# Patient Record
Sex: Male | Born: 1935 | Race: White | Hispanic: No | Marital: Married
Health system: Midwestern US, Academic
[De-identification: ages and names within clinical notes are randomized; demographics above are authoritative.]

---

## 2017-03-24 ENCOUNTER — Encounter: Admit: 2017-03-24 | Discharge: 2017-03-24 | Payer: MEDICARE

## 2017-03-24 NOTE — Telephone Encounter
INR Overdue.  Called and left message requesting pt to have INR drawn at earliest convenience.  Left callback number for any questions or concerns.

## 2017-03-29 ENCOUNTER — Encounter: Admit: 2017-03-29 | Discharge: 2017-03-29 | Payer: MEDICARE

## 2017-04-03 ENCOUNTER — Encounter: Admit: 2017-04-03 | Discharge: 2017-04-03 | Payer: MEDICARE

## 2017-04-05 MED ORDER — WARFARIN 5 MG PO TAB
ORAL_TABLET | ORAL | 4 refills | 90.00000 days | Status: AC
Start: 2017-04-05 — End: 2018-02-09

## 2017-04-13 ENCOUNTER — Encounter: Admit: 2017-04-13 | Discharge: 2017-04-13 | Payer: MEDICARE

## 2017-04-13 DIAGNOSIS — Z7901 Long term (current) use of anticoagulants: Principal | ICD-10-CM

## 2017-04-13 MED ORDER — WARFARIN 1 MG PO TAB
ORAL_TABLET | Freq: Every day | ORAL | 0 refills | 90.00000 days | Status: AC
Start: 2017-04-13 — End: 2017-12-13

## 2017-05-04 ENCOUNTER — Encounter: Admit: 2017-05-04 | Discharge: 2017-05-04 | Payer: MEDICARE

## 2017-05-04 NOTE — Telephone Encounter
Spoke to pt re: overdue INR. He states he is going to cardiac rehab on Thursday and plans to have INR at that time. No further needs identified at this time.    ----- Message -----  From: Baldwin Crown, RN  Sent: 04/26/2017  To: Suzanne Boron Nurse [facility]  Subject: INR Due 04/26/17                                   Kristeen Miss is due for an INR test on 04/26/17.

## 2017-05-06 ENCOUNTER — Encounter: Admit: 2017-05-06 | Discharge: 2017-05-06 | Payer: MEDICARE

## 2017-05-06 DIAGNOSIS — Z7901 Long term (current) use of anticoagulants: Principal | ICD-10-CM

## 2017-05-06 DIAGNOSIS — Z86711 Personal history of pulmonary embolism: ICD-10-CM

## 2017-06-11 ENCOUNTER — Encounter: Admit: 2017-06-11 | Discharge: 2017-06-11 | Payer: MEDICARE

## 2017-06-16 ENCOUNTER — Encounter: Admit: 2017-06-16 | Discharge: 2017-06-16 | Payer: MEDICARE

## 2017-06-16 DIAGNOSIS — Z86711 Personal history of pulmonary embolism: ICD-10-CM

## 2017-06-16 DIAGNOSIS — Z7901 Long term (current) use of anticoagulants: Principal | ICD-10-CM

## 2017-07-30 ENCOUNTER — Encounter: Admit: 2017-07-30 | Discharge: 2017-07-30 | Payer: MEDICARE

## 2017-07-30 NOTE — Telephone Encounter
INR Overdue.  Called and discussed with patient who states he will have INR drawn next week.

## 2017-08-06 ENCOUNTER — Encounter: Admit: 2017-08-06 | Discharge: 2017-08-06 | Payer: MEDICARE

## 2017-08-10 ENCOUNTER — Ambulatory Visit: Admit: 2017-08-10 | Discharge: 2017-08-11 | Payer: BC Managed Care – PPO

## 2017-08-10 ENCOUNTER — Encounter: Admit: 2017-08-10 | Discharge: 2017-08-10 | Payer: MEDICARE

## 2017-08-10 DIAGNOSIS — I351 Nonrheumatic aortic (valve) insufficiency: ICD-10-CM

## 2017-08-10 DIAGNOSIS — Z7901 Long term (current) use of anticoagulants: ICD-10-CM

## 2017-08-10 DIAGNOSIS — E785 Hyperlipidemia, unspecified: Secondary | ICD-10-CM

## 2017-08-10 DIAGNOSIS — I2510 Atherosclerotic heart disease of native coronary artery without angina pectoris: Secondary | ICD-10-CM

## 2017-08-10 DIAGNOSIS — I739 Peripheral vascular disease, unspecified: ICD-10-CM

## 2017-08-10 DIAGNOSIS — I10 Essential (primary) hypertension: ICD-10-CM

## 2017-08-10 DIAGNOSIS — N189 Chronic kidney disease, unspecified: ICD-10-CM

## 2017-08-10 DIAGNOSIS — I219 Acute myocardial infarction, unspecified: ICD-10-CM

## 2017-08-10 DIAGNOSIS — Z86711 Personal history of pulmonary embolism: Principal | ICD-10-CM

## 2017-08-10 DIAGNOSIS — I714 Abdominal aortic aneurysm, without rupture: ICD-10-CM

## 2017-08-10 DIAGNOSIS — Z87891 Personal history of nicotine dependence: ICD-10-CM

## 2017-08-10 DIAGNOSIS — I252 Old myocardial infarction: ICD-10-CM

## 2017-08-10 DIAGNOSIS — I749 Embolism and thrombosis of unspecified artery: ICD-10-CM

## 2017-08-10 DIAGNOSIS — Z9861 Coronary angioplasty status: ICD-10-CM

## 2017-08-10 DIAGNOSIS — E7800 Pure hypercholesterolemia, unspecified: ICD-10-CM

## 2017-08-10 DIAGNOSIS — K219 Gastro-esophageal reflux disease without esophagitis: ICD-10-CM

## 2017-08-10 DIAGNOSIS — Z951 Presence of aortocoronary bypass graft: ICD-10-CM

## 2017-08-10 NOTE — Progress Notes
Date of Service: 08/10/2017    Scott Watkins is a 83 y.o. male.       HPI     Scott Watkins returns for follow-up about 10 months after I last saw him.  At that time I was focused on asymptomatic coronary artery disease post Bypass, .hyperlipidemia, hypertension and a stable nonthreatening abdominal aortic dilation.  I had concerns about labile hypertension and whether he needed more aggressive treatment.  Since I seen him he is had no major cardiovascular problems arise.  He continues to do cardiac rehab in [place] [facility] 2 days a week.  He does about 40 minutes of exercise with elliptical exercise bike and some resistance work.  He brought in several blood pressure recordings from the rehab outpatient flowsheet.  His blood pressures are consistently in the 130/80 range at rest with readings commonly in the 110-120 range after exercise and no readings with pressures above 150 during exercise.  These are all fully satisfactory.  These compare favorably to the readings of 160 that he had when I saw him in the office last December.    His echo showed mild LV systolic dysfunction with EF 45% he had a wall thickness consistent with LV hypertrophy at about 1.2 cm.  He had no significant valvular disease.  He had mild LV dilation at 5.1 cm LVEDD with moderately severe aortic regurgitation.  I note from my record last year that he did not have a murmur of aortic regurgitation.    Scott Watkins does not have a lot of vigorous physical activity other than during cardiac rehab.  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  His associate who was carrying some of the workload left about a year and a half ago and has not been replaced.    Scott Watkins notes the difficulty in trying to lose weight with calorie restriction when he is engaged in a lot of social activities at church that revolve around food.  He notes difficulty in losing weight by exercise having been told that after his 20 minutes on the exercise bike the calories she burned to been the equivalent of about 2 cookies.    He inquired about the necessity for continuing the medications that he is on.           Vitals:    08/10/17 1448 08/10/17 1504   BP: 122/78 124/74   Pulse: 61    Weight: 88.9 kg (196 lb)    Height: 1.676 m (5' 6)      Body mass index is 31.64 kg/m???.     Past Medical History  Patient Active Problem List    Diagnosis Date Noted   ??? Neuropathy 12/14/2012   ??? PVD (peripheral vascular disease) (HCC) 12/08/2012     07/06/11 ABI during w/u leading to Dx peripheral neuropathy: Normal resting ABIs,  ???Noncompressible immediate post-exercise L ABLI, , with normal values 2 minutes post exercise., Normal post-exercise right ankle brachial indices.     ??? Chronic anticoagulation on warfarin 07/19/2012   ??? Nasal polyp 09/22/2011   ??? AAA (abdominal aortic aneurysm) (HCC) 07/08/2011   ??? 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 Dysphonia 06/24/2011   ??? Laryngopharyngeal reflux 06/24/2011   ??? Chronic rhinitis 06/24/2011   ??? History of pulmonary embolism 11/14/2010     11/13/2010 SOA during a church service associate with aphonia. Cath neg, CT PAgram: Lot in PA to RLL   Rx warfarin  11/14/2010 Venous  US: No DVT.      ??? CKD (chronic kidney disease) 11/13/2010        06/20/2014  09/25/2015  01/04/2016  01/05/2016  01/06/2016  01/07/2016    BUN 22 (H) 10 21 24 30  (H) 27 (H)   Creatinine 1.5 (H) 1.44 (H) 1.32 (H) 1.57 (H) 1.58 (H) 1.38 (H)        ??? CAD (coronary artery disease) 02/07/2009     1988 IMI, Rx lytics and RCA angioplasty 9 years after stopping smoking.   1991,Angina, Balloon angioplasty 95% LAD  Saw Dr. Alvester Morin  Sept 2004: CABx4: LIMA to LAD. Radial artery T-graft from the LIMA to OMB and the distal RCA, and SVG to DIAG by Dr. Marylouise Stacks.  2007: Stent PCI to RCA  11/13/10 cath by Dr. Paris Lore: 90% m LAD,  80-90%dLAD; 50-60% pOM1, 50-60% mPLV,Patent SVG-Dx,  patent LIMA but atretic radial T-graft , faint visualization of antegrade flow OM1 and PL branch.   07/19/12:Cath Patent LIMA-mLAD w/ Radial T to OM-rPL pbranch. patent SVG-Dx, 90% mLAD, 60% OM1, 90% Cx, 60% pR PL Branch      ??? History of acute lateral wall MI 02/07/2009   ??? 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 Hyperlipidemia 02/07/2009     01/05/16 Total 99 Trig 145 HDL 32 LDL 50 on Crestor 10.         ??? Essential hypertension 02/07/2009         Review of Systems   Constitution: Negative.   HENT: Negative.    Eyes: Negative.    Cardiovascular: Negative.    Respiratory: Negative.    Endocrine: Negative.    Hematologic/Lymphatic: Negative.    Skin: Negative.    Musculoskeletal: Negative.    Gastrointestinal: Negative.    Genitourinary: Negative.    Neurological: Negative.    Psychiatric/Behavioral: Negative.    Allergic/Immunologic: Negative.    14 organ system review noted. It is negative except as reported in current narrative or above in the ROS section. This is a patient centered review of systems that was stated by the patient in his terms prior to my personal problem oriented interview with the patient      Physical Exam  Watkins: Patient in no distress, looks generally healthy. Skin warm and dry, non icteric,    Mucous membranes moist.  Pupils equal and round  Carotids: no bruits    Thyroid not enlarged.  Neck veins: CVP <6 normal, no V wave, no HJR     Respiratory: Breathing comfortably. Lungs clear to percussion & auscultation. No rales, rhonchi or wheezing   Cardiac: Regular rhythm. LV impulse not palpable. Normal S1 & S2, Fourth heart sound, no rub or S3. No murmur  Abdomen: soft, non-tender, no masses,bruits,hepatic or aortic enlargement. + bowel sounds.   Femoral arteries: Good pulses, no bruits.  Legs/feet: Normal PT pulses, trace bilat low leg edema.   Motor: Normal muscle strength. Cognitive: Pleasant demeanor. Good insight. No depression    Cardiovascular Studies  Today's 12 lead EKG: sinus rhythm, rate 61. EKG is unchanged from prior tracings.  QRS configuration is identical to prior tracing with QRS duration 122 on both tracings left bundle branch block was called on this EKG by computer but not on the prior    His lipid profile from  March 2017  looks good with LDL 50 HDL suboptimal at 32 but not manageable with any specific med.   01/05/2016 04:17   Cholesterol 99   Triglycerides 145  HDL 32 (L)   LDL 50     Problems Addressed Today  No diagnosis found.    Assessment and Plan     Scott Watkins is doing well.  He has no symptoms of heart failure angina or arrhythmias.  His blood pressure control as evidenced by his flowsheet from cardiac rehab looks good as do the pressures today.  I did do not think that is reasonable to try to increase any of his medications even though his LV was slightly dilated with EF 45% when checked last December.  He has no findings of volume overload on exam today.  I cannot even hear a murmur of aortic insufficiency in spite of the fact that moderate AI was reported on the Doppler.    Interval surveillance of his LV function and valve disease are warranted.  I think it would be reasonable to extend the follow-up about 18 months from the prior echo.  Basically I like to see him again in the spring or summer which would make about 6-18 months since his last echo.  Like him to have another echo done shortly before he sees me in [place] so I can review the results.  The meantime I like him to continue his physical activity and try to reduce calories and lose a little weight.    As mentioned his blood pressure control looks good.    His lipid control when last checked 18 months ago looked good.  I have asked him to come in for a lipid profile one morning when he is not eaten and is here for the exercise class.  He can have a snack of some sort after the blood is taken but before he exercises if he needs to.    I reviewed his indications for oral anticoagulation with him.  In 2012 he had an unprovoked pulmonary embolism that was not associated with any type of leg compression injury or other procedure.  He had a major embolism that sounds as if it were like a saddle embolism.  In this case with no identifiable cause of the clot and severe well-documented pulmonary embolism he needs to be managed as an unprovoked PE which calls for and permanent/indefinite oral anticoagulation.  He is doing well with good pro time readings over 6 years on warfarin and can continue it.  He is comfortable doing so as long as he has clear understanding that it is necessary.    He has minimal edema which may be related to his Norvasc 5.  The edema does not bother him and his blood pressure control is satisfactory so I am leaving him on the amlodipine.  Her graph I like to see him again in 6 months with an echo Doppler done in [facility] shortly before he comes in.  In the meantime he continues his current meds.  I want him to continue his exercise program and monitoring his blood pressures.  I am sure the cardiac rehab team will advise him if his pressures are creeping upward and get him in to see his primary care physician Dr. Erskine Emery.             Current Medications (including today's revisions)  ??? amLODIPine (NORVASC) 5 mg tablet Take 1 tablet by mouth daily.   ??? aspirin EC 81 mg tablet Take 1 Tab by mouth daily.   ??? COQ10 (UBIQUINOL) PO Take 1 Tab by mouth daily.   ??? ezetimibe (ZETIA) 10 mg tablet Take 10  mg by mouth Daily.   ??? guaiFENesin LA (MUCINEX) 600 mg tablet Take 600 mg by mouth twice daily as needed.   ??? ketorolac (ACULAR) 0.5 % ophthalmic solution Apply 1 drop to left eye as directed three times daily.   ??? latanoprost (XALATAN) 0.005 % ophthalmic solution Apply 1 drop to left eye as directed three times daily.   ??? loratadine (CLARITIN) 10 mg tablet Take 10 mg by mouth Daily.   ??? losartan (COZAAR) 50 mg tablet TAKE 1 TABLET DAILY ??? metoprolol XL (TOPROL XL) 100 mg tablet Take 100 mg by mouth at bedtime daily.   ??? nitroglycerin (NITROSTAT) 0.4 mg tablet Place 1 Tab under tongue every 5 minutes as needed for Chest Pain. As directed   ??? Omega-3 Fatty Acids 1,000 mg capsule Take 1,000 mg by mouth Daily.   ??? omeprazole DR(+) (PRILOSEC) 20 mg capsule Take 1 Cap by mouth daily.   ??? prednisolone acetate (PRED FORTE) 1 % ophthalmic suspension Apply 1 drop to left eye as directed three times daily.   ??? rosuvastatin (CRESTOR) 10 mg tablet Take 10 mg by mouth Daily.   ??? vitamins, multiple Cap Take 1 Cap by mouth Daily.   ??? warfarin (COUMADIN) 1 mg tablet TAKE ONE TABLET BY MOUTH ONCE DAILY   ??? warfarin (COUMADIN) 5 mg tablet TAKE ONE-HALF TABLET BY MOUTH ON MONDAY AND FRIDAY. TAKE ONE TABLET BY MOUTH ON ALL OTHER DAYS.

## 2017-08-25 ENCOUNTER — Encounter: Admit: 2017-08-25 | Discharge: 2017-08-25 | Payer: MEDICARE

## 2017-08-25 NOTE — Telephone Encounter
Spoke to pt re: overdue INR. Pt verbalized understanding and was agreeable to this plan.    ----- Message -----  From: Baldwin Crown, RN  Sent: 08/16/2017  To: Suzanne Boron Nurse [place]  Subject: INR Due 08/16/17                                 Kristeen Miss is due for an INR test on 08/16/17.

## 2017-09-01 ENCOUNTER — Encounter: Admit: 2017-09-01 | Discharge: 2017-09-01 | Payer: MEDICARE

## 2017-09-03 ENCOUNTER — Encounter: Admit: 2017-09-03 | Discharge: 2017-09-03 | Payer: MEDICARE

## 2017-09-03 DIAGNOSIS — Z7901 Long term (current) use of anticoagulants: Principal | ICD-10-CM

## 2017-10-04 ENCOUNTER — Encounter: Admit: 2017-10-04 | Discharge: 2017-10-04 | Payer: MEDICARE

## 2017-10-07 ENCOUNTER — Encounter: Admit: 2017-10-07 | Discharge: 2017-10-07 | Payer: MEDICARE

## 2017-10-11 ENCOUNTER — Encounter: Admit: 2017-10-11 | Discharge: 2017-10-11 | Payer: MEDICARE

## 2017-10-11 MED ORDER — AMLODIPINE 5 MG PO TAB
5 mg | ORAL_TABLET | Freq: Every day | ORAL | 3 refills | Status: AC
Start: 2017-10-11 — End: ?

## 2017-11-10 ENCOUNTER — Encounter: Admit: 2017-11-10 | Discharge: 2017-11-10 | Payer: MEDICARE

## 2017-11-11 ENCOUNTER — Encounter: Admit: 2017-11-11 | Discharge: 2017-11-11 | Payer: MEDICARE

## 2017-11-11 DIAGNOSIS — Z7901 Long term (current) use of anticoagulants: Principal | ICD-10-CM

## 2017-11-11 DIAGNOSIS — Z86711 Personal history of pulmonary embolism: ICD-10-CM

## 2017-11-11 LAB — PROTIME INR (PT): Lab: 2.5

## 2017-11-25 ENCOUNTER — Encounter: Admit: 2017-11-25 | Discharge: 2017-11-25 | Payer: MEDICARE

## 2017-12-10 ENCOUNTER — Encounter: Admit: 2017-12-10 | Discharge: 2017-12-10 | Payer: MEDICARE

## 2017-12-10 DIAGNOSIS — Z7901 Long term (current) use of anticoagulants: Principal | ICD-10-CM

## 2017-12-13 MED ORDER — WARFARIN 1 MG PO TAB
ORAL_TABLET | Freq: Every day | ORAL | 3 refills | 90.00000 days | Status: AC
Start: 2017-12-13 — End: 2019-02-14

## 2017-12-17 ENCOUNTER — Encounter: Admit: 2017-12-17 | Discharge: 2017-12-17 | Payer: MEDICARE

## 2018-01-04 ENCOUNTER — Encounter: Admit: 2018-01-04 | Discharge: 2018-01-04 | Payer: MEDICARE

## 2018-01-04 ENCOUNTER — Ambulatory Visit: Admit: 2018-01-04 | Discharge: 2018-01-05 | Payer: BC Managed Care – PPO

## 2018-01-04 DIAGNOSIS — I351 Nonrheumatic aortic (valve) insufficiency: Principal | ICD-10-CM

## 2018-01-04 DIAGNOSIS — Z7901 Long term (current) use of anticoagulants: ICD-10-CM

## 2018-01-04 DIAGNOSIS — Z86711 Personal history of pulmonary embolism: ICD-10-CM

## 2018-01-04 LAB — PROTIME INR (PT): Lab: 2.2

## 2018-01-20 ENCOUNTER — Encounter: Admit: 2018-01-20 | Discharge: 2018-01-20 | Payer: MEDICARE

## 2018-01-20 DIAGNOSIS — I739 Peripheral vascular disease, unspecified: ICD-10-CM

## 2018-01-20 DIAGNOSIS — Z9861 Coronary angioplasty status: ICD-10-CM

## 2018-01-20 DIAGNOSIS — Z7901 Long term (current) use of anticoagulants: ICD-10-CM

## 2018-01-20 DIAGNOSIS — N189 Chronic kidney disease, unspecified: ICD-10-CM

## 2018-01-20 DIAGNOSIS — I219 Acute myocardial infarction, unspecified: ICD-10-CM

## 2018-01-20 DIAGNOSIS — Z87891 Personal history of nicotine dependence: ICD-10-CM

## 2018-01-20 DIAGNOSIS — E785 Hyperlipidemia, unspecified: Secondary | ICD-10-CM

## 2018-01-20 DIAGNOSIS — I10 Essential (primary) hypertension: ICD-10-CM

## 2018-01-20 DIAGNOSIS — I252 Old myocardial infarction: Secondary | ICD-10-CM

## 2018-01-20 DIAGNOSIS — Z86711 Personal history of pulmonary embolism: ICD-10-CM

## 2018-01-20 DIAGNOSIS — I2510 Atherosclerotic heart disease of native coronary artery without angina pectoris: Secondary | ICD-10-CM

## 2018-01-20 DIAGNOSIS — Z951 Presence of aortocoronary bypass graft: ICD-10-CM

## 2018-01-20 DIAGNOSIS — K219 Gastro-esophageal reflux disease without esophagitis: ICD-10-CM

## 2018-01-20 DIAGNOSIS — I714 Abdominal aortic aneurysm, without rupture: ICD-10-CM

## 2018-01-20 DIAGNOSIS — I749 Embolism and thrombosis of unspecified artery: ICD-10-CM

## 2018-01-25 ENCOUNTER — Ambulatory Visit: Admit: 2018-01-25 | Discharge: 2018-01-26 | Payer: BC Managed Care – PPO

## 2018-01-25 ENCOUNTER — Encounter: Admit: 2018-01-25 | Discharge: 2018-01-25 | Payer: MEDICARE

## 2018-01-25 DIAGNOSIS — I2510 Atherosclerotic heart disease of native coronary artery without angina pectoris: Secondary | ICD-10-CM

## 2018-01-25 DIAGNOSIS — I252 Old myocardial infarction: Secondary | ICD-10-CM

## 2018-01-25 DIAGNOSIS — T50905A Adverse effect of unspecified drugs, medicaments and biological substances, initial encounter: ICD-10-CM

## 2018-01-25 DIAGNOSIS — I10 Essential (primary) hypertension: Secondary | ICD-10-CM

## 2018-01-25 DIAGNOSIS — N189 Chronic kidney disease, unspecified: ICD-10-CM

## 2018-01-25 DIAGNOSIS — K219 Gastro-esophageal reflux disease without esophagitis: ICD-10-CM

## 2018-01-25 DIAGNOSIS — Z9861 Coronary angioplasty status: ICD-10-CM

## 2018-01-25 DIAGNOSIS — Z87891 Personal history of nicotine dependence: ICD-10-CM

## 2018-01-25 DIAGNOSIS — I219 Acute myocardial infarction, unspecified: ICD-10-CM

## 2018-01-25 DIAGNOSIS — E785 Hyperlipidemia, unspecified: Secondary | ICD-10-CM

## 2018-01-25 DIAGNOSIS — I351 Nonrheumatic aortic (valve) insufficiency: ICD-10-CM

## 2018-01-25 DIAGNOSIS — E7800 Pure hypercholesterolemia, unspecified: ICD-10-CM

## 2018-01-25 DIAGNOSIS — Z7901 Long term (current) use of anticoagulants: ICD-10-CM

## 2018-01-25 DIAGNOSIS — I749 Embolism and thrombosis of unspecified artery: ICD-10-CM

## 2018-01-25 DIAGNOSIS — Z951 Presence of aortocoronary bypass graft: ICD-10-CM

## 2018-01-25 DIAGNOSIS — I714 Abdominal aortic aneurysm, without rupture: ICD-10-CM

## 2018-01-25 DIAGNOSIS — I739 Peripheral vascular disease, unspecified: ICD-10-CM

## 2018-01-25 DIAGNOSIS — Z86711 Personal history of pulmonary embolism: ICD-10-CM

## 2018-01-25 LAB — BASIC METABOLIC PANEL
Lab: 10
Lab: 107
Lab: 14
Lab: 141
Lab: 21
Lab: 24
Lab: 4.3
Lab: 97

## 2018-01-25 MED ORDER — METOPROLOL SUCCINATE 100 MG PO TB24
50 mg | ORAL_TABLET | Freq: Every evening | ORAL | 3 refills | 90.00000 days | Status: AC
Start: 2018-01-25 — End: 2019-01-31

## 2018-01-27 ENCOUNTER — Encounter: Admit: 2018-01-27 | Discharge: 2018-01-27 | Payer: MEDICARE

## 2018-01-27 DIAGNOSIS — I2510 Atherosclerotic heart disease of native coronary artery without angina pectoris: Principal | ICD-10-CM

## 2018-02-09 ENCOUNTER — Encounter: Admit: 2018-02-09 | Discharge: 2018-02-09 | Payer: MEDICARE

## 2018-02-09 MED ORDER — WARFARIN 5 MG PO TAB
ORAL_TABLET | ORAL | 4 refills | 90.00000 days | Status: AC
Start: 2018-02-09 — End: 2019-01-09

## 2018-02-18 ENCOUNTER — Encounter: Admit: 2018-02-18 | Discharge: 2018-02-18 | Payer: MEDICARE

## 2018-02-18 DIAGNOSIS — Z7901 Long term (current) use of anticoagulants: Principal | ICD-10-CM

## 2018-02-18 LAB — PROTIME INR (PT): Lab: 1.9

## 2018-03-28 ENCOUNTER — Encounter: Admit: 2018-03-28 | Discharge: 2018-03-28 | Payer: MEDICARE

## 2018-03-29 ENCOUNTER — Encounter: Admit: 2018-03-29 | Discharge: 2018-03-29 | Payer: MEDICARE

## 2018-03-29 DIAGNOSIS — Z86711 Personal history of pulmonary embolism: ICD-10-CM

## 2018-03-29 DIAGNOSIS — Z7901 Long term (current) use of anticoagulants: ICD-10-CM

## 2018-03-29 LAB — PROTIME INR (PT): Lab: 2.3

## 2018-04-26 ENCOUNTER — Encounter: Admit: 2018-04-26 | Discharge: 2018-04-26 | Payer: MEDICARE

## 2018-04-26 ENCOUNTER — Ambulatory Visit: Admit: 2018-04-26 | Discharge: 2018-04-27 | Payer: BC Managed Care – PPO

## 2018-04-26 DIAGNOSIS — E785 Hyperlipidemia, unspecified: ICD-10-CM

## 2018-04-26 DIAGNOSIS — Z951 Presence of aortocoronary bypass graft: ICD-10-CM

## 2018-04-26 DIAGNOSIS — K219 Gastro-esophageal reflux disease without esophagitis: ICD-10-CM

## 2018-04-26 DIAGNOSIS — I2510 Atherosclerotic heart disease of native coronary artery without angina pectoris: Principal | ICD-10-CM

## 2018-04-26 DIAGNOSIS — N189 Chronic kidney disease, unspecified: ICD-10-CM

## 2018-04-26 DIAGNOSIS — I714 Abdominal aortic aneurysm, without rupture: ICD-10-CM

## 2018-04-26 DIAGNOSIS — Z86711 Personal history of pulmonary embolism: ICD-10-CM

## 2018-04-26 DIAGNOSIS — Z9861 Coronary angioplasty status: ICD-10-CM

## 2018-04-26 DIAGNOSIS — I10 Essential (primary) hypertension: ICD-10-CM

## 2018-04-26 DIAGNOSIS — E7800 Pure hypercholesterolemia, unspecified: ICD-10-CM

## 2018-04-26 DIAGNOSIS — I749 Embolism and thrombosis of unspecified artery: ICD-10-CM

## 2018-04-26 DIAGNOSIS — I219 Acute myocardial infarction, unspecified: ICD-10-CM

## 2018-04-26 DIAGNOSIS — Z87891 Personal history of nicotine dependence: ICD-10-CM

## 2018-04-26 DIAGNOSIS — I739 Peripheral vascular disease, unspecified: ICD-10-CM

## 2018-04-26 DIAGNOSIS — I252 Old myocardial infarction: ICD-10-CM

## 2018-04-26 DIAGNOSIS — Z7901 Long term (current) use of anticoagulants: ICD-10-CM

## 2018-05-10 ENCOUNTER — Encounter: Admit: 2018-05-10 | Discharge: 2018-05-10 | Payer: MEDICARE

## 2018-05-12 ENCOUNTER — Encounter: Admit: 2018-05-12 | Discharge: 2018-05-12 | Payer: MEDICARE

## 2018-05-12 DIAGNOSIS — Z86711 Personal history of pulmonary embolism: ICD-10-CM

## 2018-05-12 DIAGNOSIS — Z7901 Long term (current) use of anticoagulants: Principal | ICD-10-CM

## 2018-05-12 LAB — PROTIME INR (PT): Lab: 2

## 2018-06-27 ENCOUNTER — Encounter: Admit: 2018-06-27 | Discharge: 2018-06-27 | Payer: MEDICARE

## 2018-07-01 ENCOUNTER — Encounter: Admit: 2018-07-01 | Discharge: 2018-07-01 | Payer: MEDICARE

## 2018-08-05 ENCOUNTER — Encounter: Admit: 2018-08-05 | Discharge: 2018-08-05 | Payer: MEDICARE

## 2018-09-06 ENCOUNTER — Encounter: Admit: 2018-09-06 | Discharge: 2018-09-06 | Payer: MEDICARE

## 2018-09-06 DIAGNOSIS — Z86711 Personal history of pulmonary embolism: ICD-10-CM

## 2018-09-06 DIAGNOSIS — Z7901 Long term (current) use of anticoagulants: Principal | ICD-10-CM

## 2018-10-14 ENCOUNTER — Encounter: Admit: 2018-10-14 | Discharge: 2018-10-14 | Payer: MEDICARE

## 2018-10-17 ENCOUNTER — Encounter: Admit: 2018-10-17 | Discharge: 2018-10-17 | Payer: MEDICARE

## 2018-10-17 DIAGNOSIS — Z7901 Long term (current) use of anticoagulants: ICD-10-CM

## 2018-10-17 DIAGNOSIS — Z86711 Personal history of pulmonary embolism: Principal | ICD-10-CM

## 2018-10-17 LAB — PROTIME INR (PT): Lab: 2.6

## 2018-11-25 ENCOUNTER — Encounter: Admit: 2018-11-25 | Discharge: 2018-11-25 | Payer: MEDICARE

## 2018-12-12 NOTE — Telephone Encounter
Overdue INR reminder letter sent to home address on file.     ----- Message -----  From: Caryl Pina, RN  Sent: 11/14/2018  To: [facility] Nurse [place]  Subject: INR Due 11/14/18                                  Scott Watkins is due for an INR test on 11/14/18.

## 2018-12-16 LAB — PROTIME INR (PT): Lab: 2.2

## 2018-12-20 ENCOUNTER — Encounter: Admit: 2018-12-20 | Discharge: 2018-12-20 | Payer: MEDICARE

## 2018-12-20 DIAGNOSIS — Z7901 Long term (current) use of anticoagulants: Principal | ICD-10-CM

## 2018-12-20 DIAGNOSIS — Z86711 Personal history of pulmonary embolism: ICD-10-CM

## 2019-01-07 ENCOUNTER — Encounter: Admit: 2019-01-07 | Discharge: 2019-01-07 | Payer: MEDICARE

## 2019-01-09 MED ORDER — WARFARIN 5 MG PO TAB
ORAL_TABLET | Freq: Every day | ORAL | 0 refills | 90.00000 days | Status: AC
Start: 2019-01-09 — End: 2019-04-04

## 2019-01-28 ENCOUNTER — Encounter: Admit: 2019-01-28 | Discharge: 2019-01-28 | Payer: MEDICARE

## 2019-01-28 DIAGNOSIS — I2510 Atherosclerotic heart disease of native coronary artery without angina pectoris: Principal | ICD-10-CM

## 2019-01-31 ENCOUNTER — Encounter: Admit: 2019-01-31 | Discharge: 2019-01-31 | Payer: MEDICARE

## 2019-01-31 ENCOUNTER — Ambulatory Visit: Admit: 2019-01-31 | Discharge: 2019-02-01 | Payer: BC Managed Care – PPO

## 2019-01-31 DIAGNOSIS — Z9861 Coronary angioplasty status: ICD-10-CM

## 2019-01-31 DIAGNOSIS — I77811 Abdominal aortic ectasia: Principal | ICD-10-CM

## 2019-01-31 DIAGNOSIS — E785 Hyperlipidemia, unspecified: Secondary | ICD-10-CM

## 2019-01-31 DIAGNOSIS — I749 Embolism and thrombosis of unspecified artery: ICD-10-CM

## 2019-01-31 DIAGNOSIS — I714 Abdominal aortic aneurysm, without rupture: ICD-10-CM

## 2019-01-31 DIAGNOSIS — Z7901 Long term (current) use of anticoagulants: ICD-10-CM

## 2019-01-31 DIAGNOSIS — Z86711 Personal history of pulmonary embolism: ICD-10-CM

## 2019-01-31 DIAGNOSIS — N189 Chronic kidney disease, unspecified: ICD-10-CM

## 2019-01-31 DIAGNOSIS — I10 Essential (primary) hypertension: Secondary | ICD-10-CM

## 2019-01-31 DIAGNOSIS — K219 Gastro-esophageal reflux disease without esophagitis: ICD-10-CM

## 2019-01-31 DIAGNOSIS — I2510 Atherosclerotic heart disease of native coronary artery without angina pectoris: ICD-10-CM

## 2019-01-31 DIAGNOSIS — Z951 Presence of aortocoronary bypass graft: ICD-10-CM

## 2019-01-31 DIAGNOSIS — I739 Peripheral vascular disease, unspecified: ICD-10-CM

## 2019-01-31 DIAGNOSIS — I252 Old myocardial infarction: ICD-10-CM

## 2019-01-31 DIAGNOSIS — I219 Acute myocardial infarction, unspecified: ICD-10-CM

## 2019-01-31 DIAGNOSIS — Z87891 Personal history of nicotine dependence: ICD-10-CM

## 2019-01-31 MED ORDER — METOPROLOL SUCCINATE 100 MG PO TB24
ORAL_TABLET | Freq: Every day | ORAL | 0 refills | 90.00000 days | Status: DC
Start: 2019-01-31 — End: 2019-03-02

## 2019-01-31 NOTE — Progress Notes
Date of Service: 01/31/2019    Scott Watkins is a 85 y.o. male.       HPI     Obtained patient's, or patient proxy's, verbal consent to treat them and their agreement to [facility] financial policy and NPP via this telehealth visit during the [employer] Emergency  I conducted a phone visit with Rev.  Zigler today.  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 He is still busy with church.  He tells me that he has phone 11 with what it is college classmates and they are due to be married this Friday.Will just be he and his fianc???e witnesses in the with her minister who will be performing the service.    He had a question about his aneurysm.  I reviewed the chart and saw that he had had 2 ultrasounds that reported abdominal aortic span of 3.0cm in 2012 at the time of his scan for renal artery stenosis and again in 2017.  This is simply an ectatic aorta not an aneurysm at this span.  Did not progress over a period of 5 years.  He is fully active.  He has a history of coronary disease with four-vessel bypass in September 2004 with no recurrent angina. Marland Kitchen  He is tolerating his medications well.  Vitals:    01/31/19 0743   BP: 130/64   Pulse: 55   SpO2: 97%   Weight: 88 kg (194 lb)   Height: 1.702 m (5' 7)     Body mass index is 30.38 kg/m???.     Past Medical History  Patient Active Problem List    Diagnosis Date Noted   ??? Adverse drug effect: Fatigue on TOprol XL 100  01/25/2018     Reported on January 25, 2018.  Toprol reduced from 100 mg down to 50.     ??? Nonrheumatic aortic valve insufficiency 08/10/2017     Mod AI noted on echo-Doppler Dec 2017 EF 45%, no murmur or symptoms.      ??? Neuropathy 12/14/2012   ??? PVD (peripheral vascular disease) (HCC) 12/08/2012     07/06/11 ABI during w/u leading to Dx peripheral neuropathy: Normal resting ABIs,  ???Noncompressible immediate post-exercise L ABLI, , with minutes as needed for Chest Pain. As directed   ??? Omega-3 Fatty Acids 1,000 mg capsule Take 1,000 mg by mouth Daily.   ??? omeprazole DR(+) (PRILOSEC) 20 mg capsule Take 1 Cap by mouth daily.   ??? rosuvastatin (CRESTOR) 10 mg tablet Take 10 mg by mouth Daily.   ??? vitamins, multiple Cap Take 1 Cap by mouth Daily.   ??? warfarin (COUMADIN) 1 mg tablet TAKE 1 TABLET BY MOUTH ONCE DAILY   ??? warfarin (COUMADIN) 5 mg tablet TAKE 1/2 (ONE-HALF) TABLET BY MOUTH ONCE DAILY ON  MONDAY  AND  FRIDAY.  TAKE  1  TABLET  BY  MOUTH  ON  ALL  OTHER  DAYS.

## 2019-02-14 ENCOUNTER — Encounter: Admit: 2019-02-14 | Discharge: 2019-02-14 | Payer: MEDICARE

## 2019-02-14 DIAGNOSIS — Z7901 Long term (current) use of anticoagulants: Principal | ICD-10-CM

## 2019-02-14 MED ORDER — WARFARIN 1 MG PO TAB
1-2 mg | ORAL_TABLET | Freq: Every day | ORAL | 3 refills | 90.00000 days | Status: DC
Start: 2019-02-14 — End: 2020-02-13

## 2019-02-17 ENCOUNTER — Encounter: Admit: 2019-02-17 | Discharge: 2019-02-17 | Payer: MEDICARE

## 2019-02-24 ENCOUNTER — Encounter: Admit: 2019-02-24 | Discharge: 2019-02-24 | Payer: MEDICARE

## 2019-02-24 DIAGNOSIS — Z7901 Long term (current) use of anticoagulants: Principal | ICD-10-CM

## 2019-02-24 DIAGNOSIS — Z86711 Personal history of pulmonary embolism: ICD-10-CM

## 2019-02-24 LAB — PROTIME INR (PT): Lab: 2 mL/min — ABNORMAL LOW (ref >60)

## 2019-03-02 ENCOUNTER — Encounter: Admit: 2019-03-02 | Discharge: 2019-03-02 | Payer: MEDICARE

## 2019-03-02 DIAGNOSIS — I2510 Atherosclerotic heart disease of native coronary artery without angina pectoris: Principal | ICD-10-CM

## 2019-03-02 MED ORDER — METOPROLOL SUCCINATE 100 MG PO TB24
ORAL_TABLET | Freq: Every day | ORAL | 3 refills | 90.00000 days | Status: DC
Start: 2019-03-02 — End: 2019-08-15

## 2019-04-04 ENCOUNTER — Encounter: Admit: 2019-04-04 | Discharge: 2019-04-04

## 2019-04-04 MED ORDER — WARFARIN 5 MG PO TAB
2.5-5 mg | ORAL_TABLET | Freq: Every day | ORAL | 3 refills | 90.00000 days | Status: DC
Start: 2019-04-04 — End: 2020-02-13

## 2019-04-05 LAB — PROTIME INR (PT): Lab: 1.8

## 2019-04-06 ENCOUNTER — Encounter: Admit: 2019-04-06 | Discharge: 2019-04-06

## 2019-04-06 DIAGNOSIS — Z7901 Long term (current) use of anticoagulants: Secondary | ICD-10-CM

## 2019-04-06 DIAGNOSIS — Z86711 Personal history of pulmonary embolism: Secondary | ICD-10-CM

## 2019-05-05 ENCOUNTER — Encounter: Admit: 2019-05-05 | Discharge: 2019-05-05

## 2019-05-05 NOTE — Progress Notes
-----   Message -----  From: Rosaria Ferries, LPN  Sent: 4/81/8590   5:05 PM CDT  To: Orlin Hilding, RN  Subject: RE: pls send INR overdue reminder letter         This is done    ----- Message -----  From: Orlin Hilding, RN  Sent: 05/05/2019   4:29 PM CDT  To: Rosaria Ferries, LPN  Subject: pls send INR overdue reminder letter

## 2019-05-19 NOTE — Telephone Encounter
Attempted to reach pt again re: overdue INR but still receiving "all circuits busy" msg for preferred # and alternate # for friend Sonia Side [phone]) remains disconnected.

## 2019-06-02 ENCOUNTER — Encounter: Admit: 2019-06-02 | Discharge: 2019-06-02

## 2019-06-02 DIAGNOSIS — I77811 Abdominal aortic ectasia: Secondary | ICD-10-CM

## 2019-06-02 DIAGNOSIS — I739 Peripheral vascular disease, unspecified: Secondary | ICD-10-CM

## 2019-06-02 NOTE — Progress Notes
INR Overdue.  Called and left message requesting pt to have INR drawn at earliest convenience.  Left callback number for any questions or concerns.

## 2019-07-14 ENCOUNTER — Encounter: Admit: 2019-07-14 | Discharge: 2019-07-14 | Payer: MEDICARE

## 2019-07-14 NOTE — Telephone Encounter
INR Overdue. Called and left message requesting pt to have INR drawn at earliest convenience.  Left callback number for any questions or concerns.

## 2019-07-28 ENCOUNTER — Encounter: Admit: 2019-07-28 | Discharge: 2019-07-28 | Payer: MEDICARE

## 2019-08-15 ENCOUNTER — Encounter: Admit: 2019-08-15 | Discharge: 2019-08-15 | Payer: MEDICARE

## 2019-08-15 DIAGNOSIS — Z86711 Personal history of pulmonary embolism: Secondary | ICD-10-CM

## 2019-08-15 DIAGNOSIS — I219 Acute myocardial infarction, unspecified: Secondary | ICD-10-CM

## 2019-08-15 DIAGNOSIS — I749 Embolism and thrombosis of unspecified artery: Secondary | ICD-10-CM

## 2019-08-15 DIAGNOSIS — I2510 Atherosclerotic heart disease of native coronary artery without angina pectoris: Secondary | ICD-10-CM

## 2019-08-15 DIAGNOSIS — Z7901 Long term (current) use of anticoagulants: Secondary | ICD-10-CM

## 2019-08-15 DIAGNOSIS — I351 Nonrheumatic aortic (valve) insufficiency: Secondary | ICD-10-CM

## 2019-08-15 DIAGNOSIS — I10 Essential (primary) hypertension: Secondary | ICD-10-CM

## 2019-08-15 DIAGNOSIS — Z87891 Personal history of nicotine dependence: Secondary | ICD-10-CM

## 2019-08-15 DIAGNOSIS — E7800 Pure hypercholesterolemia, unspecified: Secondary | ICD-10-CM

## 2019-08-15 DIAGNOSIS — E785 Hyperlipidemia, unspecified: Secondary | ICD-10-CM

## 2019-08-15 DIAGNOSIS — I77811 Abdominal aortic ectasia: Secondary | ICD-10-CM

## 2019-08-15 DIAGNOSIS — I714 Abdominal aortic aneurysm, without rupture: Secondary | ICD-10-CM

## 2019-08-15 DIAGNOSIS — N189 Chronic kidney disease, unspecified: Secondary | ICD-10-CM

## 2019-08-15 DIAGNOSIS — I739 Peripheral vascular disease, unspecified: Secondary | ICD-10-CM

## 2019-08-15 DIAGNOSIS — K219 Gastro-esophageal reflux disease without esophagitis: Secondary | ICD-10-CM

## 2019-08-15 DIAGNOSIS — Z9861 Coronary angioplasty status: Secondary | ICD-10-CM

## 2019-08-15 DIAGNOSIS — Z951 Presence of aortocoronary bypass graft: Secondary | ICD-10-CM

## 2019-08-15 DIAGNOSIS — I252 Old myocardial infarction: Secondary | ICD-10-CM

## 2019-08-15 MED ORDER — METOPROLOL SUCCINATE 25 MG PO TB24
25 mg | ORAL_TABLET | Freq: Every day | ORAL | 3 refills | 90.00000 days | Status: AC
Start: 2019-08-15 — End: ?

## 2019-08-15 NOTE — Progress Notes
Date of Service: 08/15/2019    Scott Watkins is a 85 y.o. male.       HPI     Scott Watkins returns for the first physical visit has had since July 2019 all although we had a good evaluation process on a Zoom telehealth visit in April.  He has had no major health setbacks since we have spoken which was actually the exact date of his 31 birthday.  He is more than 7 years out from his last cath that showed his bypass grafts from 2004 were widely patent.  He has had no need for stents or other revascularization since the bypass.  His neuropathy is worsening making it difficult for him to walk.  This limits his physical capacity although he is going to the rehab outpatient center at what is now called [facility] with good reports.  His O2 sats look good his blood pressures are satisfactory and is heart rates have been in range.      He denies having typical symptoms suggesting the presence of angina, heart failure, TIA, claudication or arrhythmias.         07/27/2019 00:00   INR 2.4   Sodium 139   Potassium 4.5   Chloride 106   CO2 23.0   Anion Gap 15 (H)   Blood Urea Nitrogen 24.0   Creatinine 1.40 (H)   eGFR Non African American 51.1   Glucose 92   Calcium 9.4   Cholesterol 133   Triglycerides 227 (H)   HDL 45   LDL 43   VLDL 45 (H)     His marriage which was held 3 days after I had a video visit with him on April 14 occurred as planned.        Vitals:    08/15/19 0747 08/15/19 0755   BP: 128/74 132/76   BP Source: Arm, Left Upper Arm, Right Upper   Pulse: 76    Temp: 36.2 ?C (97.1 ?F)    SpO2: 98%    Weight: 89.4 kg (197 lb)    Height: 1.702 m (5' 7)    PainSc: Zero      Body mass index is 30.85 kg/m?Marland Kitchen     Past Medical History  Patient Active Problem List    Diagnosis Date Noted   ? Adverse drug effect: Fatigue on TOprol XL 100  01/25/2018     Reported on January 25, 2018.  Toprol reduced from 100 mg down to 50.     ? Nonrheumatic aortic valve insufficiency 08/10/2017 Mod AI noted on echo-Doppler Dec 2017 EF 45%, no murmur or symptoms.      ? Neuropathy 12/14/2012   ? PVD (peripheral vascular disease) (HCC) 12/08/2012     07/06/11 ABI during w/u leading to Dx peripheral neuropathy: Normal resting ABIs,  ?Noncompressible immediate post-exercise L ABLI, , with normal values 2 minutes post exercise., Normal post-exercise right ankle brachial indices.     ? 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 Nasal polyp 09/22/2011   ? Aortic ectasia, abdominal (HCC) 07/08/2011     2012 abdominal aortic span 3.0 cm of visualized at time of that showed no renal artery stenosis   2017 abdominal ultrasound shows abdominal aortic ectasia unchanged at 3.0  April 2020: Patient counseled that abdominal aortic ectasia is not  aneurysmal change in the prior further imaging.  Diagnosis of abdominal aortic aneurysm changed to abdominal aortic ectasia.     ? Vocal cord bowing 06/24/2011   ? Dysphonia 06/24/2011   ? Laryngopharyngeal reflux 06/24/2011   ? Chronic rhinitis 06/24/2011   ?  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 11/14/2010     11/13/2010 SOA during a church service associated with aphonia. Cath neg, CT PAgram: Clot in PA to RLL   Rx warfarin  11/14/2010 Venous US: No DVT.      ? CKD (chronic kidney disease) 11/13/2010        06/20/2014  09/25/2015  01/04/2016  01/05/2016  01/06/2016  01/07/2016    BUN 22 (H) 10 21 24 30  (H) 27 (H)   Creatinine 1.5 (H) 1.44 (H) 1.32 (H) 1.57 (H) 1.58 (H) 1.38 (H)        ? CAD (coronary artery disease) 02/07/2009     1988 IMI, Rx lytics and RCA angioplasty 9 years after stopping smoking.   1991,Angina, Balloon angioplasty 95% LAD  Saw Dr. Alvester Morin  Sept 2004: CABx4: LIMA to LAD. Radial artery T-graft from the LIMA to OMB and the distal RCA, and SVG to DIAG by Dr. Marylouise Stacks.  2007: Stent PCI to RCA 11/13/10 cath by Dr. Paris Lore: 90% m LAD,  80-90%dLAD; 50-60% pOM1, 50-60% mPLV,Patent SVG-Dx,  patent LIMA but atretic radial T-graft , faint visualization of antegrade flow OM1 and PL branch.   07/19/12:Cath Patent LIMA-mLAD w/ Radial T to OM-rPL pbranch. patent SVG-Dx, 90% mLAD, 60% OM1, 90% Cx, 60% pR PL Branch      ? History of acute lateral wall MI 02/07/2009   ? 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 Hyperlipidemia 02/07/2009     01/05/16 Total 99 Trig 145 HDL 32 LDL 50 on Crestor 10 & Zetia         ? Essential hypertension 02/07/2009         Review of Systems   Constitution: Positive for malaise/fatigue.   HENT: Negative.    Eyes: Negative.    Cardiovascular: Negative.    Respiratory: Negative.    Endocrine: Negative.    Hematologic/Lymphatic: Negative.    Skin: Negative.    Musculoskeletal: Negative.    Gastrointestinal: Negative.    Genitourinary: Positive for frequency.   Neurological: Negative.    Psychiatric/Behavioral: Negative.    Allergic/Immunologic: Negative.    14 organ system review noted. It is negative except as reported in current narrative or above in the ROS section. This is a patient centered review of systems that was stated by the patient in his terms prior to my personal problem oriented interview with the patient     Physical Exam  General: Patient in no distress, looks generally healthy. Skin warm and dry, non icteric,    Mucous membranes moist.  Pupils equal and round  Carotids: no bruits    Thyroid not enlarged.  Neck veins: CVP <6 normal, no V wave, no HJR     Respiratory: Breathing comfortably. Lungs clear to percussion & auscultation. No rales, rhonchi or wheezing   Cardiac: Regular rhythm. LV impulse not palpable. Normal S1 & S2, Fourth heart sound, no rub or S3. No murmur  Abdomen: soft, non-tender, no masses,bruits,hepatic or aortic enlargement. + bowel sounds.   Femoral arteries: Good pulses, no bruits.  Legs/feet: Normal PT pulses, trace bilat low leg edema. Motor: Normal muscle strength. Cognitive: Pleasant demeanor. Good insight. No depression    Cardiovascular Studies  Today's 12 lead EKG: sinus rhythm, rate 62 onld anterior MI, IVCD, one PVC.     Problems Addressed Today  No diagnosis found.    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  His neuropathy is progressing.  He is been told previously by a neurologist at [facility] that this is idiopathic and does not have any specific therapy.  It may be that the gabapentin might be helpful.  That is outside my scope of practice to to try that medicine.  I do not know whether his family physician Dr. Erskine Emery would prescribe this or make a referral.  Reference regular recall that he was very impressed with the neurologist he saw at [facility].  I checked our EMR for neurology contact since see that he saw Dr. Tandy Gaw.  I will send an alert to Dr. Sunday Corn that [facility] [place] is seeking a reappointment reevaluation with him which should facilitate the scheduling process.    He believes the Toprol-XL is making him tired of the 50 mg dose.  It is very reasonable to reduce the dose to 25.  He will start he can see if his blood pressure is going up when he is at rehab.  If that is the case he could probably increase his losartan to 100mg  to obtain optimal blood pressure control.  That would be a better idea than increasing the Norvasc to 10 because of the increased edema seen with 10 mg Norvasc compared to 5.    From a cardiac standpoint I I cannot do much to improve his efforts at calorie restriction this is one of the most challenging things in life.  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  With the restrictions of social gatherings with Covid this may not be as much of a problem but it still is an issue. He remains on what is been recommended to be lifetime oral anticoagulation with warfarin after suffering a life-threatening saddle pulmonary embolism. Said no recurrences of any thing suggesting DVT or PE      NB: The free text in this document was generated through Dragon(TM) software with editing and proofreading  done by the author of this document Dr. Mable Paris MD, [facility] principally at the point of care. Some errors may persist.  If there are questions about content in this document please contact Dr. Hale Bogus.    The written information I provided Scott Watkins at the conclusion of today's encounter is as  follows:    Patient Instructions   Overall I think things are going well for you.  Your cholesterol numbers look good.  Your blood pressure looks good here and at the rehab at [facility].  No think you need any specific cardiac testing.    Toprol-XL at a 50 mg dose certainly could be causing you to feel tired.  Your heart rate is in the low 60s when you see me see me today.  If you reduce the Toprol dose to 25 mg her heart rate may run a little higher and the fatigue may improve.  Your blood pressure might start to run a little higher as well so you need to keep track of that.  They will notice a cardiac rehab if your pressures up.  Your heart rate might run a little faster when you are exercising which actually might be  a good thing    The neuropathy is your limiting factor right now for activity.  The physician Dr. Tandy Gaw who saw few years ago is at [facility] and still busy with seeing patients.  I will send him an alert that she will be seeking an appointment.    I do not think there is anything specific you need to do to improve your cardiac health.  The calorie restriction is goal for 90% of my patients and myself as well.  There is no magic formula for this.  If there were there would not be so many diet books on the market.    Really do not think ready to see you more than annually.  I could see you in 6 months if you wish just to touch base and make sure nothing is going wrong but your exam looks good you do not have a heart murmur your lungs are clear and your neck arteries show no signs of obstruction.  On the whole I think you can count on continuation of your current meds with good results    Return to see me for a recheck in one year.   I can see you sooner if needed.   Call in if you have problems or questions.   Marissa Nestle, MD                Current Medications (including today's revisions)  ? amLODIPine (NORVASC) 5 mg tablet Take one tablet by mouth daily.   ? aspirin EC 81 mg tablet Take 1 Tab by mouth daily.   ? COQ10 (UBIQUINOL) PO Take 1 Tab by mouth daily.   ? ezetimibe (ZETIA) 10 mg tablet Take 10 mg by mouth Daily.   ? guaiFENesin LA (MUCINEX) 600 mg tablet Take 600 mg by mouth twice daily as needed.   ? loratadine (CLARITIN) 10 mg tablet Take 10 mg by mouth Daily.   ? losartan (COZAAR) 50 mg tablet TAKE 1 TABLET DAILY   ? metoprolol XL (TOPROL XL) 100 mg extended release tablet TAKE 1/2 (ONE-HALF) TABLET BY MOUTH ONCE DAILY AT BEDTIME   ? nitroglycerin (NITROSTAT) 0.4 mg tablet Place 1 Tab under tongue every 5 minutes as needed for Chest Pain. As directed   ? Omega-3 Fatty Acids 1,000 mg capsule Take 1,000 mg by mouth Daily.   ? omeprazole DR(+) (PRILOSEC) 20 mg capsule Take 1 Cap by mouth daily.   ? rosuvastatin (CRESTOR) 10 mg tablet Take 10 mg by mouth Daily.   ? vitamins, multiple Cap Take 1 Cap by mouth Daily.   ? warfarin (COUMADIN) 1 mg tablet Take one tablet to two tablets by mouth daily.   ? warfarin (COUMADIN) 5 mg tablet Take one-half tablet to one tablet by mouth daily. As directed by cardiology   ? zinc sulfate (ZINC-15 PO) Take  by mouth.

## 2019-09-22 ENCOUNTER — Encounter: Admit: 2019-09-22 | Discharge: 2019-09-22 | Payer: MEDICARE

## 2019-09-22 NOTE — Telephone Encounter
INR Overdue. Called and left message requesting pt to have INR drawn at earliest convenience.  Left callback number for any questions or concerns.

## 2019-09-27 ENCOUNTER — Encounter: Admit: 2019-09-27 | Discharge: 2019-09-27 | Payer: MEDICARE

## 2019-09-27 DIAGNOSIS — Z86711 Personal history of pulmonary embolism: Secondary | ICD-10-CM

## 2019-09-27 DIAGNOSIS — Z7901 Long term (current) use of anticoagulants: Secondary | ICD-10-CM

## 2019-11-10 ENCOUNTER — Encounter: Admit: 2019-11-10 | Discharge: 2019-11-10 | Payer: MEDICARE

## 2019-11-10 DIAGNOSIS — I77811 Abdominal aortic ectasia: Secondary | ICD-10-CM

## 2019-11-10 DIAGNOSIS — Z86711 Personal history of pulmonary embolism: Secondary | ICD-10-CM

## 2019-11-10 DIAGNOSIS — Z7901 Long term (current) use of anticoagulants: Secondary | ICD-10-CM

## 2019-11-10 DIAGNOSIS — I739 Peripheral vascular disease, unspecified: Secondary | ICD-10-CM

## 2019-11-10 LAB — PROTIME INR (PT): Lab: 2.1

## 2019-11-10 NOTE — Telephone Encounter
INR Overdue. Called and left message requesting pt to have INR drawn at earliest convenience.  Left callback number for any questions or concerns.

## 2019-12-12 ENCOUNTER — Encounter: Admit: 2019-12-12 | Discharge: 2019-12-12 | Payer: MEDICARE

## 2019-12-12 DIAGNOSIS — I10 Essential (primary) hypertension: Secondary | ICD-10-CM

## 2019-12-25 ENCOUNTER — Encounter: Admit: 2019-12-25 | Discharge: 2019-12-25 | Payer: MEDICARE

## 2019-12-25 NOTE — Telephone Encounter
INR Overdue. Called and left message requesting pt to have INR drawn at earliest convenience.  Left callback number for any questions or concerns.

## 2020-01-03 ENCOUNTER — Encounter: Admit: 2020-01-03 | Discharge: 2020-01-03 | Payer: MEDICARE

## 2020-01-03 DIAGNOSIS — I739 Peripheral vascular disease, unspecified: Secondary | ICD-10-CM

## 2020-01-03 DIAGNOSIS — I77811 Abdominal aortic ectasia: Secondary | ICD-10-CM

## 2020-01-03 LAB — PROTIME INR (PT): Lab: 1.8

## 2020-01-08 NOTE — Patient Instructions
Dr. Alric Ran and his staff appreciate the opportunity to care for you today. A few items to assist you with what comes next:     *If lab work or imaging was ordered, I will be in contact with you only if the results are abnormal enough that Dr. Alric Ran requests for your to be notified. If needed, adjustments to the plan will be made.  You are very welcome to contact me to discuss results sooner if you'd prefer.     *All testing and lab work be done here at [facility] unless insurance requires an alternative.  If any part of your care such as a referral doesn't seem to be getting scheduled within a week, please contact our office so we can assist you.     *Dr. Alric Ran has asked that you be scheduled for a follow up appointment in a specific amount of time to go over all results with him.  If all testing has not been completed by your scheduled follow up appointment, we will need to reschedule this appointment.  At your follow up appointment he will explain results, continue neurological treatment or discharge you back to your referring physician.      Feel free to call or message me through my chart.  I'm here to assist you between appointments and can discuss concerns with Dr. Alric Ran as needed.      Changes in appointment and wait list requests can be made by calling our scheduling line at [phone].     Our goal is to provide you with the very best patient care. If we have not met this expectation, please call me to discuss as we appreciate opportunities for improvement.  We are very glad to have met you today and hope you were pleased with the care you received.     Sincerely,    Doctor, general practice, RN, BSN  Clinical Nurse Coordinator with Dr. Morton Peters  Department of Neurology, [facility]  [address] [place], [place] [address]  [phone] (ph) [phone] (Fax)  [email or web address]    .

## 2020-01-11 ENCOUNTER — Encounter: Admit: 2020-01-11 | Discharge: 2020-01-11 | Payer: MEDICARE

## 2020-01-11 ENCOUNTER — Ambulatory Visit: Admit: 2020-01-11 | Discharge: 2020-01-11 | Payer: BC Managed Care – PPO

## 2020-01-11 DIAGNOSIS — E785 Hyperlipidemia, unspecified: Secondary | ICD-10-CM

## 2020-01-11 DIAGNOSIS — I2510 Atherosclerotic heart disease of native coronary artery without angina pectoris: Secondary | ICD-10-CM

## 2020-01-11 DIAGNOSIS — I739 Peripheral vascular disease, unspecified: Secondary | ICD-10-CM

## 2020-01-11 DIAGNOSIS — I749 Embolism and thrombosis of unspecified artery: Secondary | ICD-10-CM

## 2020-01-11 DIAGNOSIS — N189 Chronic kidney disease, unspecified: Secondary | ICD-10-CM

## 2020-01-11 DIAGNOSIS — G609 Hereditary and idiopathic neuropathy, unspecified: Secondary | ICD-10-CM

## 2020-01-11 DIAGNOSIS — Z86711 Personal history of pulmonary embolism: Secondary | ICD-10-CM

## 2020-01-11 DIAGNOSIS — E8881 Metabolic syndrome: Secondary | ICD-10-CM

## 2020-01-11 DIAGNOSIS — K219 Gastro-esophageal reflux disease without esophagitis: Secondary | ICD-10-CM

## 2020-01-11 DIAGNOSIS — I77811 Abdominal aortic ectasia: Secondary | ICD-10-CM

## 2020-01-11 DIAGNOSIS — Z951 Presence of aortocoronary bypass graft: Secondary | ICD-10-CM

## 2020-01-11 DIAGNOSIS — I714 Abdominal aortic aneurysm, without rupture: Secondary | ICD-10-CM

## 2020-01-11 DIAGNOSIS — Z87891 Personal history of nicotine dependence: Secondary | ICD-10-CM

## 2020-01-11 DIAGNOSIS — Z7901 Long term (current) use of anticoagulants: Secondary | ICD-10-CM

## 2020-01-11 DIAGNOSIS — I10 Essential (primary) hypertension: Secondary | ICD-10-CM

## 2020-01-11 DIAGNOSIS — I252 Old myocardial infarction: Secondary | ICD-10-CM

## 2020-01-11 DIAGNOSIS — I219 Acute myocardial infarction, unspecified: Secondary | ICD-10-CM

## 2020-01-11 DIAGNOSIS — Z9861 Coronary angioplasty status: Secondary | ICD-10-CM

## 2020-01-11 LAB — COMPREHENSIVE METABOLIC PANEL
Lab: 0.7 mg/dL (ref 0.3–1.2)
Lab: 1.3 mg/dL — ABNORMAL HIGH (ref 0.4–1.24)
Lab: 112 mg/dL — ABNORMAL HIGH (ref 70–100)
Lab: 140 MMOL/L (ref 137–147)
Lab: 17 U/L (ref 7–56)
Lab: 23 MMOL/L (ref 21–30)
Lab: 24 U/L (ref 7–40)
Lab: 4.3 g/dL (ref 3.5–5.0)
Lab: 4.5 MMOL/L (ref 3.5–5.1)
Lab: 50 mL/min — ABNORMAL LOW (ref >60)
Lab: 52 U/L (ref 25–110)
Lab: 7.1 g/dL — ABNORMAL LOW (ref 6.0–8.0)
Lab: 9 10*3/uL (ref 3–12)
Lab: >60 mL/min (ref >60)

## 2020-01-11 LAB — HEMOGLOBIN A1C: Lab: 6.3 % — ABNORMAL HIGH (ref 4.0–6.0)

## 2020-01-11 LAB — SED RATE: Lab: 11 mm/h (ref 0–20)

## 2020-01-11 LAB — CBC AND DIFF
Lab: 16 g/dL (ref 13.5–16.5)
Lab: 4.9 M/UL (ref 4.4–5.5)
Lab: 47 % (ref 40–50)
Lab: 7.4 10*3/uL (ref 4.5–11.0)

## 2020-01-11 LAB — VITAMIN B12: Lab: 515 pg/mL (ref 180–914)

## 2020-01-11 LAB — PROTIME INR (PT): Lab: 1.8 mg/dL — ABNORMAL HIGH (ref 0.8–1.2)

## 2020-01-11 MED ORDER — ALPHA LIPOIC ACID 600 MG PO TAB
600 mg | ORAL_TABLET | Freq: Every day | ORAL | 3 refills | Status: AC
Start: 2020-01-11 — End: ?

## 2020-01-11 NOTE — Progress Notes
Date of Service: 01/11/2020     Subjective:               Scott Watkins is a 86 y.o. male.        History of Present Illness    86 year old male referred by Dr. Marissa Nestle for polyneuropathy.    I saw this patient before in November 2014 but he was lost to follow-up.  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  On last visit November 2014, he declined medication for nerve pain because symptoms were not severe enough to warrant medication.  I recommended over-the-counter capsaicin cream for neuralgia as needed.  We discussed fall precautions due to risk of injury from imbalance.    Last EMG/NCS I did was in March 2014.  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  I graded severity at that time mild to moderate.    Previous labs from 2014: 2-hour GTT was 153, RF negative, RPR nonreactive, SPEP normal, IFE normal, Lyme negative, ANA negative, ESR 0, B12 458, folic acid normal, TSH 2.76    ANA in 2012 was positive with a titer 320    Dr. Hale Bogus referred him back to me for reappointment reevaluation because of progressive symptoms.    Numbness has progressed in the feet.  It ascends to the knees.  He has had no tingling now (but was initially).  He has burning in the feet.  At times they feel really hot.  It does not wake him.  He has occasional leg cramps.      Severity reaches 5/10.  Frequency of numbness is daily.  Burning is 1-2 times per week.  Standing worsens symptoms.  Standing to preach sermon worsens it.      He has had some balance difficulty.  Climbing stairs is tough wo railing.  He fell once in the last year.  He tried to climb stairs wo rail then.      Numbness in the hands began recently.  His fingers feel weaker than before. Grip has weakened.  He has not dropped anything.  He denies UE/LE radicular pain.           Review of Systems   Constitutional: Negative.    HENT: Negative.    Eyes: Negative. Respiratory: Negative.    Cardiovascular: Negative.    Gastrointestinal: Negative.    Endocrine: Negative.    Genitourinary: Positive for frequency.   Musculoskeletal: Positive for arthralgias.   Skin: Negative.    Allergic/Immunologic: Negative.    Neurological: Positive for numbness.   Hematological: Negative.    Psychiatric/Behavioral: Negative.        Chief Complaint:  Chief Complaint   Patient presents with   ? Neuropathy     BLE       Past Medical History:  Medical History:   Diagnosis Date   ? AAA (abdominal aortic aneurysm) (HCC) 07/08/2011   ? CAD (coronary artery disease) 02/07/2009   ? Chronic anticoagulation 07/19/2012   ? CKD (chronic kidney disease) 11/13/2010   ? Coronary atherosclerosis    ? Coronary atherosclerosis 02/07/2009   ? 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 History of acute lateral wall MI 02/07/2009   ? History of PTCA 02/07/2009   ? History of pulmonary embolism 11/14/2010   ? History of tobacco use 02/25/2010   ? HLD (hyperlipidemia)    ? HTN (hypertension)    ? Hyperlipidemia  02/07/2009   ? Hypertension, essential, benign 02/07/2009   ? Laryngopharyngeal reflux 06/24/2011   ? Myocardial infarction [facility])    ? Old MI (myocardial infarction)     lateral wall   ? Peripheral vascular disease (HCC)    ? S/P CABG x 4 2004       Surgical History:  Surgical History:   Procedure Laterality Date   ? HX CORONARY ARTERY BYPASS GRAFT  06/29/2003    Dr. Marvel Plan   ? Lt Heart Cath With Ventriculogram N/A 01/06/2016    Performed by Cath, Physician at [facility] CATH LAB   ? Coronary Angiography N/A 01/06/2016    Performed by Cath, Physician at [facility] CATH LAB   ? Possible Percutaneous Coronary Intervention N/A 01/06/2016    Performed by Cath, Physician at [facility] CATH LAB   ? CATHETER IMPLANT/REVISION     ? HX CORONARY STENT PLACEMENT     ? HX TONSILLECTOMY      1934       Social History:   Social History     Socioeconomic History   ? Marital status: Widowed     Spouse name: Not on file   ? Number of children: Not on file   ? Years of education: Not on file   ? Highest education level: Not on file   Occupational History   ? Not on file   Tobacco Use   ? Smoking status: Former Smoker     Packs/day: 2.50     Years: 30.00     Pack years: 75.00     Types: Cigarettes     Quit date: 10/19/1977     Years since quitting: 42.2   ? Smokeless tobacco: Never Used   Substance and Sexual Activity   ? Alcohol use: Yes     Comment: very seldom   ? Drug use: No   ? Sexual activity: Not Currently   Other Topics Concern   ? Not on file   Social History Narrative   ? Not on file       Family History:  Family History   Problem Relation Age of Onset   ? Heart Attack Mother    ? Heart Attack Father        Allergies:  Allergies   Allergen Reactions   ? Sulfa (Sulfonamide Antibiotics)      Allergy recorded in SMS: Sulfa~Reactions: HIVES   ? Lipitor [Atorvastatin] MUSCLE PAIN   ? Lisinopril      Allergy recorded in SMS: LISINOPRIL~Reactions: COUGH   ? Vytorin 10-10 [Ezetimibe-Simvastatin] JOINT PAIN     Hip pain       Objective:         ? amLODIPine (NORVASC) 5 mg tablet Take one tablet by mouth daily.   ? aspirin EC 81 mg tablet Take 1 Tab by mouth daily.   ? COQ10 (UBIQUINOL) PO Take 1 Tab by mouth daily.   ? ezetimibe (ZETIA) 10 mg tablet Take 10 mg by mouth Daily.   ? guaiFENesin LA (MUCINEX) 600 mg tablet Take 600 mg by mouth twice daily as needed.   ? loratadine (CLARITIN) 10 mg tablet Take 10 mg by mouth Daily.   ? losartan (COZAAR) 50 mg tablet TAKE 1 TABLET DAILY   ? metoprolol XL (TOPROL XL) 25 mg extended release tablet Take one tablet by mouth daily.   ? nitroglycerin (NITROSTAT) 0.4 mg tablet Place 1 Tab under tongue every 5 minutes as needed for Chest Pain. As directed   ?  Omega-3 Fatty Acids 1,000 mg capsule Take 1,000 mg by mouth Daily.   ? omeprazole DR(+) (PRILOSEC) 20 mg capsule Take 1 Cap by mouth daily.   ? rosuvastatin (CRESTOR) 10 mg tablet Take 10 mg by mouth Daily.   ? vitamins, multiple Cap Take 1 Cap by mouth Daily.   ? warfarin (COUMADIN) 1 mg tablet Take one tablet to two tablets by mouth daily. (Patient taking differently: Take 1-2 mg by mouth daily. 1.5 tabs 3 x per week)   ? warfarin (COUMADIN) 5 mg tablet Take one-half tablet to one tablet by mouth daily. As directed by cardiology   ? zinc sulfate (ZINC-15 PO) Take  by mouth.     Vitals:    01/11/20 0909   BP: (!) 169/85   BP Source: Arm, Left Upper   Patient Position: Sitting   Pulse: 69   Resp: 16   Temp: 36.2 ?C (97.1 ?F)   SpO2: 97%   Weight: 89.4 kg (197 lb 1.5 oz)   Height: 170.2 cm (67.01)   PainSc: Zero     Body mass index is 30.86 kg/m?Marland Kitchen       Physical Exam    General: WG/WN/WD, ASA, demeanor,pleasant, follows commands  HEENT: NC/AT  Cardiac: RRR without murmur  Neck: supple, full ROM    Speech: fluent, no dysarthria or aphasia  Mental status: Alert, oriented x 4, NAD    CN: PERRL, EOMI without restriction or nystagmus, facial sensation symmetric (V1-V3) to LT bilaterally, No facial droop or ptosis, hearing equal to FR, palatal rise symmetric, cough response strong and intact, shoulder shrug symmetric, tongue midline    Strength:   UEs: 5/5 SA/EF/EE/WE/WF/FA/TA  LEs: 5/5 HF/KE/DF/PF  No involuntary movements, no tremor    Sensation: PP decreased below midpalms and knees symmetrically     DTRs: 1/1 in BR/B 0/0P/A, downgoing plantar reflexes bilaterally    Coordination: normal bilateral FTN without dysmetria  Gait: slow primary base and station, sensory ataxia in tandem gait       Assessment and Plan:    1. Idiopathic polyneuropathy  2. Insulin resistance    Plan:  1. Patient declines Gabapentin or other prescribed meds  2. Start alpha lipoic acid 600 mg daily  3. Refer PT - balance training and fall prevention - fall risk  4. Check A1c, B12, ESR, CMP, CBC, ANA  5. OTC Capsaicin cream recommended prn QID  6. RTC 6 months or prn sooner if symptoms evolve    Total visit duration today was 49 minutes

## 2020-02-13 MED ORDER — WARFARIN 1 MG PO TAB
ORAL_TABLET | ORAL | 3 refills | 90.00000 days | Status: AC
Start: 2020-02-13 — End: ?

## 2020-02-13 MED ORDER — WARFARIN 2 MG PO TAB
4 mg | ORAL_TABLET | Freq: Every day | ORAL | 3 refills | 90.00000 days | Status: AC
Start: 2020-02-13 — End: ?

## 2020-03-20 ENCOUNTER — Encounter: Admit: 2020-03-20 | Discharge: 2020-03-20 | Payer: MEDICARE

## 2020-03-20 NOTE — Telephone Encounter
INR Overdue. Called and left message requesting pt to have INR drawn at earliest convenience.  Left callback number for any questions or concerns.

## 2020-03-21 ENCOUNTER — Encounter: Admit: 2020-03-21 | Discharge: 2020-03-21 | Payer: MEDICARE

## 2020-03-21 DIAGNOSIS — I77811 Abdominal aortic ectasia: Secondary | ICD-10-CM

## 2020-03-21 DIAGNOSIS — I739 Peripheral vascular disease, unspecified: Secondary | ICD-10-CM

## 2020-03-21 LAB — PROTIME INR (PT): Lab: 1.8

## 2020-03-22 ENCOUNTER — Encounter: Admit: 2020-03-22 | Discharge: 2020-03-22 | Payer: MEDICARE

## 2020-04-12 ENCOUNTER — Encounter: Admit: 2020-04-12 | Discharge: 2020-04-12 | Payer: MEDICARE

## 2020-04-12 NOTE — Telephone Encounter
INR Overdue. Called and left message requesting pt to have INR drawn at earliest convenience.  Left callback number for any questions or concerns.

## 2020-04-15 ENCOUNTER — Encounter: Admit: 2020-04-15 | Discharge: 2020-04-15 | Payer: MEDICARE

## 2020-04-15 LAB — PROTIME INR (PT): Lab: 2.6 — ABNORMAL HIGH (ref 0.89–1.11)

## 2020-05-09 ENCOUNTER — Encounter: Admit: 2020-05-09 | Discharge: 2020-05-09 | Payer: MEDICARE

## 2020-05-09 DIAGNOSIS — I77811 Abdominal aortic ectasia: Secondary | ICD-10-CM

## 2020-05-09 DIAGNOSIS — I739 Peripheral vascular disease, unspecified: Secondary | ICD-10-CM

## 2020-05-09 LAB — PROTIME INR (PT): Lab: 2.4

## 2020-06-28 ENCOUNTER — Encounter: Admit: 2020-06-28 | Discharge: 2020-06-28 | Payer: MEDICARE

## 2020-06-28 DIAGNOSIS — I77811 Abdominal aortic ectasia: Secondary | ICD-10-CM

## 2020-06-28 DIAGNOSIS — I739 Peripheral vascular disease, unspecified: Secondary | ICD-10-CM

## 2020-06-28 LAB — PROTIME INR (PT): Lab: 2.2

## 2020-06-28 NOTE — Telephone Encounter
INR Overdue. Called and left message requesting pt to have INR drawn at earliest convenience.  Left callback number for any questions or concerns.

## 2020-07-18 ENCOUNTER — Encounter: Admit: 2020-07-18 | Discharge: 2020-07-18 | Payer: MEDICARE

## 2020-07-19 ENCOUNTER — Encounter: Admit: 2020-07-19 | Discharge: 2020-07-19 | Payer: MEDICARE

## 2020-07-19 DIAGNOSIS — Z951 Presence of aortocoronary bypass graft: Secondary | ICD-10-CM

## 2020-07-19 DIAGNOSIS — E7800 Pure hypercholesterolemia, unspecified: Secondary | ICD-10-CM

## 2020-07-19 DIAGNOSIS — I2510 Atherosclerotic heart disease of native coronary artery without angina pectoris: Secondary | ICD-10-CM

## 2020-07-30 ENCOUNTER — Encounter: Admit: 2020-07-30 | Discharge: 2020-07-30 | Payer: MEDICARE

## 2020-07-30 DIAGNOSIS — Z951 Presence of aortocoronary bypass graft: Secondary | ICD-10-CM

## 2020-07-30 DIAGNOSIS — Z5181 Encounter for therapeutic drug level monitoring: Secondary | ICD-10-CM

## 2020-07-30 DIAGNOSIS — E7800 Pure hypercholesterolemia, unspecified: Secondary | ICD-10-CM

## 2020-07-30 DIAGNOSIS — I2510 Atherosclerotic heart disease of native coronary artery without angina pectoris: Secondary | ICD-10-CM

## 2020-07-30 LAB — LIPID PROFILE
Lab: 3
Lab: 46
Lab: 58
Lab: 105 mg/dL
Lab: 125 mg/dL
Lab: 21

## 2020-08-13 ENCOUNTER — Encounter: Admit: 2020-08-13 | Discharge: 2020-08-13 | Payer: MEDICARE

## 2020-08-13 DIAGNOSIS — K219 Gastro-esophageal reflux disease without esophagitis: Secondary | ICD-10-CM

## 2020-08-13 DIAGNOSIS — I219 Acute myocardial infarction, unspecified: Secondary | ICD-10-CM

## 2020-08-13 DIAGNOSIS — E785 Hyperlipidemia, unspecified: Secondary | ICD-10-CM

## 2020-08-13 DIAGNOSIS — I252 Old myocardial infarction: Secondary | ICD-10-CM

## 2020-08-13 DIAGNOSIS — Z9861 Coronary angioplasty status: Secondary | ICD-10-CM

## 2020-08-13 DIAGNOSIS — I714 Abdominal aortic aneurysm, without rupture: Secondary | ICD-10-CM

## 2020-08-13 DIAGNOSIS — Z86711 Personal history of pulmonary embolism: Secondary | ICD-10-CM

## 2020-08-13 DIAGNOSIS — Z951 Presence of aortocoronary bypass graft: Secondary | ICD-10-CM

## 2020-08-13 DIAGNOSIS — I739 Peripheral vascular disease, unspecified: Secondary | ICD-10-CM

## 2020-08-13 DIAGNOSIS — I10 Essential (primary) hypertension: Secondary | ICD-10-CM

## 2020-08-13 DIAGNOSIS — Z87891 Personal history of nicotine dependence: Secondary | ICD-10-CM

## 2020-08-13 DIAGNOSIS — I749 Embolism and thrombosis of unspecified artery: Secondary | ICD-10-CM

## 2020-08-13 DIAGNOSIS — Z7901 Long term (current) use of anticoagulants: Secondary | ICD-10-CM

## 2020-08-13 DIAGNOSIS — I2510 Atherosclerotic heart disease of native coronary artery without angina pectoris: Secondary | ICD-10-CM

## 2020-08-13 DIAGNOSIS — N189 Chronic kidney disease, unspecified: Secondary | ICD-10-CM

## 2020-08-29 ENCOUNTER — Encounter: Admit: 2020-08-29 | Discharge: 2020-08-29 | Payer: MEDICARE

## 2020-08-29 DIAGNOSIS — I2510 Atherosclerotic heart disease of native coronary artery without angina pectoris: Secondary | ICD-10-CM

## 2020-08-29 MED ORDER — METOPROLOL SUCCINATE 25 MG PO TB24
ORAL_TABLET | Freq: Every day | ORAL | 11 refills | 90.00000 days | Status: AC
Start: 2020-08-29 — End: ?

## 2020-09-16 ENCOUNTER — Encounter: Admit: 2020-09-16 | Discharge: 2020-09-16 | Payer: MEDICARE

## 2020-09-16 NOTE — Telephone Encounter
Attempted to reach pt on cell phone but no answer and unable to leave msg as recording stated "all circuits are busy." Left msg for pt;s spouse Scott Watkins on her phone. No further needs identified at this time.    ----- Message -----  From: Rogelia Boga, RN  Sent: 08/27/2020  To: Bridget Hartshorn Nurse [place]  Subject: INR Due 08/27/20                                  Scott Watkins is due for an INR test on 08/27/20.

## 2020-09-23 ENCOUNTER — Encounter: Admit: 2020-09-23 | Discharge: 2020-09-23 | Payer: MEDICARE

## 2020-09-23 MED ORDER — HYDROCHLOROTHIAZIDE 12.5 MG PO TAB
12.5 mg | ORAL_TABLET | Freq: Every morning | ORAL | 3 refills | 30.00000 days | Status: AC
Start: 2020-09-23 — End: ?

## 2020-09-23 MED ORDER — AMLODIPINE 2.5 MG PO TAB
2.5 mg | ORAL_TABLET | Freq: Every day | ORAL | 3 refills | Status: AC
Start: 2020-09-23 — End: ?

## 2020-09-23 NOTE — Telephone Encounter
I called Scott Watkins to discuss an overdue INR.  While talking, he states that he is recovering from COVID.  He was inpatient last week at [facility] in [place], [place] for COVID where he received monoclonal antibodies.  He is also currently on Oxygen Therapy at home.  Since he has been home, he has been noticing significant swelling in his feet, and is unable to put his shoes on due to the swelling.  He does still have some intermittent shortness of breath.  He reports that his pulse ox stays consistently above 90%, even when he feels winded.  He denies any weight gain or any other symptoms.  He does not check his heart rate and blood pressure at home.  He is scheduled to follow up on Wednesday with his PCP, but states that when he has had swelling before, Dr. Hale Bogus has prescribed lasix for him.  He is wondering if that is needed again.    Will route to Dr. Hale Bogus for recommendations.

## 2020-09-23 NOTE — Telephone Encounter
Returned pt's call concerning edema.  Reviewed Dr. Francee Nodal recommendation to decrease amlodipine to 2.5mg  daily and add HCTZ 12.5mg  daily. Pt verbalized understanding and new RX for hctz sent to [facility].        Rogelia Boga, RN     JC   09/23/20 12:32 PM  Note     I called Scott Watkins to discuss an overdue INR.  While talking, he states that he is recovering from COVID.  He was inpatient last week at [facility] in [place], [place] for COVID where he received monoclonal antibodies.  He is also currently on Oxygen Therapy at home.  Since he has been home, he has been noticing significant swelling in his feet, and is unable to put his shoes on due to the swelling.  He does still have some intermittent shortness of breath.  He reports that his pulse ox stays consistently above 90%, even when he feels winded.  He denies any weight gain or any other symptoms.  He does not check his heart rate and blood pressure at home.  He is scheduled to follow up on Wednesday with his PCP, but states that when he has had swelling before, Dr. Hale Bogus has prescribed lasix for him.  He is wondering if that is needed again.    Will route to Dr. Hale Bogus for recommendations.

## 2020-10-02 ENCOUNTER — Ambulatory Visit: Admit: 2020-10-02 | Discharge: 2020-10-02 | Payer: MEDICARE

## 2020-10-02 ENCOUNTER — Encounter: Admit: 2020-10-02 | Discharge: 2020-10-02 | Payer: MEDICARE

## 2020-10-02 DIAGNOSIS — R609 Edema, unspecified: Secondary | ICD-10-CM

## 2020-10-02 DIAGNOSIS — R0902 Hypoxemia: Secondary | ICD-10-CM

## 2020-10-02 DIAGNOSIS — Z5181 Encounter for therapeutic drug level monitoring: Secondary | ICD-10-CM

## 2020-10-02 LAB — PROTIME INR (PT): Lab: 1.3

## 2020-10-02 NOTE — Progress Notes
10/02/2020 4:16 PM Pt states he recently was in hospital for COVID. INR result in the hospital got up to 8 and pt was experiencing frequent nose bleeds. Coumadin was held per pt's doctor at hospital for 3 days. Pt restarted his coumadin on Tuesday 10/01/20 at 4mg  daily. Will continue with 4mg  daily and recheck on Wednesday. Pt verbalized understanding and was in agreeable to plan. , RN

## 2020-10-08 ENCOUNTER — Encounter: Admit: 2020-10-08 | Discharge: 2020-10-08 | Payer: MEDICARE

## 2020-10-08 DIAGNOSIS — Z7901 Long term (current) use of anticoagulants: Secondary | ICD-10-CM

## 2020-10-08 DIAGNOSIS — Z86711 Personal history of pulmonary embolism: Secondary | ICD-10-CM

## 2020-10-23 ENCOUNTER — Encounter: Admit: 2020-10-23 | Discharge: 2020-10-23 | Payer: MEDICARE

## 2020-10-23 DIAGNOSIS — Z7901 Long term (current) use of anticoagulants: Secondary | ICD-10-CM

## 2020-10-23 DIAGNOSIS — Z86711 Personal history of pulmonary embolism: Secondary | ICD-10-CM

## 2020-11-13 ENCOUNTER — Encounter: Admit: 2020-11-13 | Discharge: 2020-11-13 | Payer: MEDICARE

## 2020-11-13 NOTE — Telephone Encounter
INR Overdue. Called and left message requesting pt to have INR drawn at earliest convenience.  Left callback number for any questions or concerns.

## 2020-11-15 ENCOUNTER — Encounter: Admit: 2020-11-15 | Discharge: 2020-11-15 | Payer: MEDICARE

## 2020-11-15 DIAGNOSIS — Z5181 Encounter for therapeutic drug level monitoring: Secondary | ICD-10-CM

## 2020-11-15 LAB — PROTIME INR (PT): Lab: 1.9

## 2020-12-31 ENCOUNTER — Encounter: Admit: 2020-12-31 | Discharge: 2020-12-31 | Payer: MEDICARE

## 2020-12-31 NOTE — Telephone Encounter
INR Overdue.  Discussed with with patient.  He will get INR checked this week.

## 2021-01-01 ENCOUNTER — Encounter: Admit: 2021-01-01 | Discharge: 2021-01-01 | Payer: MEDICARE

## 2021-01-01 DIAGNOSIS — Z5181 Encounter for therapeutic drug level monitoring: Secondary | ICD-10-CM

## 2021-01-01 LAB — PROTIME INR (PT): Lab: 2.2

## 2021-01-28 ENCOUNTER — Encounter: Admit: 2021-01-28 | Discharge: 2021-01-28 | Payer: MEDICARE

## 2021-01-28 DIAGNOSIS — Z87891 Personal history of nicotine dependence: Secondary | ICD-10-CM

## 2021-01-28 DIAGNOSIS — I2510 Atherosclerotic heart disease of native coronary artery without angina pectoris: Secondary | ICD-10-CM

## 2021-01-28 DIAGNOSIS — I749 Embolism and thrombosis of unspecified artery: Secondary | ICD-10-CM

## 2021-01-28 DIAGNOSIS — K219 Gastro-esophageal reflux disease without esophagitis: Secondary | ICD-10-CM

## 2021-01-28 DIAGNOSIS — I10 Essential (primary) hypertension: Secondary | ICD-10-CM

## 2021-01-28 DIAGNOSIS — N189 Chronic kidney disease, unspecified: Secondary | ICD-10-CM

## 2021-01-28 DIAGNOSIS — E785 Hyperlipidemia, unspecified: Secondary | ICD-10-CM

## 2021-01-28 DIAGNOSIS — I714 Abdominal aortic aneurysm, without rupture: Secondary | ICD-10-CM

## 2021-01-28 DIAGNOSIS — I252 Old myocardial infarction: Secondary | ICD-10-CM

## 2021-01-28 DIAGNOSIS — I219 Acute myocardial infarction, unspecified: Secondary | ICD-10-CM

## 2021-01-28 DIAGNOSIS — Z951 Presence of aortocoronary bypass graft: Secondary | ICD-10-CM

## 2021-01-28 DIAGNOSIS — Z86711 Personal history of pulmonary embolism: Secondary | ICD-10-CM

## 2021-01-28 DIAGNOSIS — I739 Peripheral vascular disease, unspecified: Secondary | ICD-10-CM

## 2021-01-28 DIAGNOSIS — Z7901 Long term (current) use of anticoagulants: Secondary | ICD-10-CM

## 2021-01-28 DIAGNOSIS — E7800 Pure hypercholesterolemia, unspecified: Secondary | ICD-10-CM

## 2021-01-28 DIAGNOSIS — Z9861 Coronary angioplasty status: Secondary | ICD-10-CM

## 2021-01-28 NOTE — Patient Instructions
Thank you for visiting our office today.    Continue the same medications as you have been doing.          We will be pursuing the following tests after your appointment today:       Orders Placed This Encounter    LIPID PROFILE         We will plan to see you back in 6 months.  Please call us in the meantime with any questions or concerns.        Please allow 5-7 business days for our providers to review your results. All normal results will go to MyChart. If you do not have Mychart, it is strongly recommended to get this so you can easily view all your results. If you do not have mychart, we will attempt to call you once with normal lab and testing results. If we cannot reach you by phone with normal results, we will send you a letter.  If you have not heard the results of your testing after one week please give us a call.       Your Cardi[facility] Team (Steve, Lisa, Jamie and Avonelle Viveros)  phone number is [phone].

## 2021-02-13 ENCOUNTER — Encounter: Admit: 2021-02-13 | Discharge: 2021-02-13 | Payer: MEDICARE

## 2021-02-13 MED ORDER — WARFARIN 2 MG PO TAB
ORAL_TABLET | Freq: Every day | ORAL | 11 refills | 90.00000 days | Status: AC
Start: 2021-02-13 — End: ?

## 2021-03-04 ENCOUNTER — Encounter: Admit: 2021-03-04 | Discharge: 2021-03-04 | Payer: MEDICARE

## 2021-03-04 NOTE — Telephone Encounter
INR Overdue.  Called and left message requesting pt to have INR drawn at earliest convenience.  Left callback number for any questions or concerns.

## 2021-03-07 ENCOUNTER — Encounter: Admit: 2021-03-07 | Discharge: 2021-03-07 | Payer: MEDICARE

## 2021-03-07 DIAGNOSIS — Z5181 Encounter for therapeutic drug level monitoring: Secondary | ICD-10-CM

## 2021-03-07 LAB — PROTIME INR (PT): INR: 3.7 — ABNORMAL HIGH

## 2021-03-14 ENCOUNTER — Encounter: Admit: 2021-03-14 | Discharge: 2021-03-14 | Payer: MEDICARE

## 2021-03-14 DIAGNOSIS — Z86711 Personal history of pulmonary embolism: Secondary | ICD-10-CM

## 2021-03-14 DIAGNOSIS — Z7901 Long term (current) use of anticoagulants: Secondary | ICD-10-CM

## 2021-03-28 ENCOUNTER — Encounter: Admit: 2021-03-28 | Discharge: 2021-03-28 | Payer: MEDICARE

## 2021-03-28 NOTE — Telephone Encounter
Attempted to call pt on cell but VM full. Left msg for pt's spouse re: overdue INR. Left [facility] nurse line # for call back prn.    ----- Message -----  From: Weston Brass  Sent: 03/21/2021  12:00 AM CDT  To: [facility]  Subject: INR Due 03/21/21                                   Bronson Ing is due for an INR test on 03/21/21.

## 2021-03-31 ENCOUNTER — Encounter: Admit: 2021-03-31 | Discharge: 2021-03-31 | Payer: MEDICARE

## 2021-03-31 DIAGNOSIS — Z5181 Encounter for therapeutic drug level monitoring: Secondary | ICD-10-CM

## 2021-03-31 LAB — PROTIME INR (PT): INR: 2.6 — ABNORMAL HIGH (ref 0.89–1.11)

## 2021-04-23 ENCOUNTER — Encounter: Admit: 2021-04-23 | Discharge: 2021-04-23 | Payer: MEDICARE

## 2021-04-23 NOTE — Telephone Encounter
Called and left message requesting pt to have INR drawn at earliest convenience.  Left callback number for any questions or concerns.

## 2021-04-25 ENCOUNTER — Encounter: Admit: 2021-04-25 | Discharge: 2021-04-25 | Payer: MEDICARE

## 2021-04-25 DIAGNOSIS — Z5181 Encounter for therapeutic drug level monitoring: Secondary | ICD-10-CM

## 2021-06-03 ENCOUNTER — Encounter: Admit: 2021-06-03 | Discharge: 2021-06-03 | Payer: MEDICARE

## 2021-06-03 NOTE — Telephone Encounter
LM asking pt to call. He has given permission to leave a message on his cell phone, but this number rings to a church and is answered by a receptionist. Pt was not available. I left a message on pt's home phone asking him to call.

## 2021-06-06 ENCOUNTER — Encounter: Admit: 2021-06-06 | Discharge: 2021-06-06 | Payer: MEDICARE

## 2021-06-06 DIAGNOSIS — Z5181 Encounter for therapeutic drug level monitoring: Secondary | ICD-10-CM

## 2021-06-06 LAB — PROTIME INR (PT): INR: 2.8 — ABNORMAL HIGH (ref 0.89–1.11)

## 2021-07-15 ENCOUNTER — Encounter: Admit: 2021-07-15 | Discharge: 2021-07-15 | Payer: MEDICARE

## 2021-07-15 NOTE — Telephone Encounter
INR Overdue.  Called and left message requesting pt to have INR drawn at earliest convenience.  Left callback number for any questions or concerns.

## 2021-07-25 ENCOUNTER — Encounter: Admit: 2021-07-25 | Discharge: 2021-07-25 | Payer: MEDICARE

## 2021-07-25 DIAGNOSIS — Z5181 Encounter for therapeutic drug level monitoring: Secondary | ICD-10-CM

## 2021-07-25 LAB — PROTIME INR (PT): INR: 2.2

## 2021-07-28 ENCOUNTER — Encounter: Admit: 2021-07-28 | Discharge: 2021-07-28 | Payer: MEDICARE

## 2021-07-28 DIAGNOSIS — I2510 Atherosclerotic heart disease of native coronary artery without angina pectoris: Secondary | ICD-10-CM

## 2021-07-28 MED ORDER — METOPROLOL SUCCINATE 25 MG PO TB24
ORAL_TABLET | Freq: Every day | ORAL | 3 refills | 90.00000 days | Status: AC
Start: 2021-07-28 — End: ?

## 2021-08-05 ENCOUNTER — Encounter: Admit: 2021-08-05 | Discharge: 2021-08-05 | Payer: MEDICARE

## 2021-08-05 DIAGNOSIS — I2510 Atherosclerotic heart disease of native coronary artery without angina pectoris: Secondary | ICD-10-CM

## 2021-08-05 DIAGNOSIS — I10 Essential (primary) hypertension: Secondary | ICD-10-CM

## 2021-08-05 NOTE — Telephone Encounter
-----   Message from Altamease Oiler, MD sent at 08/04/2021  8:50 AM CDT -----  Lets have him try and focus on drinking more water for the next couple of weeks.  Repeat basic metabolic panel 1 week before I see him in November.  Thank you  ----- Message -----  From: Rogelia Boga, RN  Sent: 07/25/2021   1:33 PM CDT  To: Altamease Oiler, MD    Labs for your review.  I have sent to PCP also.  Creatinine is slightly elevated.  I have left a message for the patient to see if he has had any medication changes, will update you once I hear back from.  Please let me know if you have any additional recommendations.  Thanks!

## 2021-08-05 NOTE — Telephone Encounter
Left message with results and recommendations.  Left callback number for any questions or concerns.  Faxed lab order to [facility] hospital.

## 2021-08-21 ENCOUNTER — Encounter: Admit: 2021-08-21 | Discharge: 2021-08-21 | Payer: MEDICARE

## 2021-08-21 DIAGNOSIS — Z7901 Long term (current) use of anticoagulants: Secondary | ICD-10-CM

## 2021-08-21 DIAGNOSIS — I2510 Atherosclerotic heart disease of native coronary artery without angina pectoris: Secondary | ICD-10-CM

## 2021-08-21 DIAGNOSIS — E7800 Pure hypercholesterolemia, unspecified: Secondary | ICD-10-CM

## 2021-08-21 DIAGNOSIS — I252 Old myocardial infarction: Secondary | ICD-10-CM

## 2021-08-21 DIAGNOSIS — I10 Essential (primary) hypertension: Secondary | ICD-10-CM

## 2021-08-21 DIAGNOSIS — Z951 Presence of aortocoronary bypass graft: Secondary | ICD-10-CM

## 2021-08-21 LAB — BASIC METABOLIC PANEL
ANION GAP: 10
CO2: 22 — ABNORMAL LOW (ref 23–31)
POTASSIUM: 4.9
SODIUM: 139

## 2021-08-21 LAB — PROTIME INR (PT): INR: 1.7

## 2021-08-26 ENCOUNTER — Encounter: Admit: 2021-08-26 | Discharge: 2021-08-26 | Payer: MEDICARE

## 2021-08-26 DIAGNOSIS — Z136 Encounter for screening for cardiovascular disorders: Secondary | ICD-10-CM

## 2021-08-26 DIAGNOSIS — I219 Acute myocardial infarction, unspecified: Secondary | ICD-10-CM

## 2021-08-26 DIAGNOSIS — Z951 Presence of aortocoronary bypass graft: Secondary | ICD-10-CM

## 2021-08-26 DIAGNOSIS — I739 Peripheral vascular disease, unspecified: Secondary | ICD-10-CM

## 2021-08-26 DIAGNOSIS — Z9861 Coronary angioplasty status: Secondary | ICD-10-CM

## 2021-08-26 DIAGNOSIS — I2510 Atherosclerotic heart disease of native coronary artery without angina pectoris: Secondary | ICD-10-CM

## 2021-08-26 DIAGNOSIS — I252 Old myocardial infarction: Secondary | ICD-10-CM

## 2021-08-26 DIAGNOSIS — I714 AAA (abdominal aortic aneurysm): Secondary | ICD-10-CM

## 2021-08-26 DIAGNOSIS — Z86711 Personal history of pulmonary embolism: Secondary | ICD-10-CM

## 2021-08-26 DIAGNOSIS — N189 Chronic kidney disease, unspecified: Secondary | ICD-10-CM

## 2021-08-26 DIAGNOSIS — I10 Essential (primary) hypertension: Secondary | ICD-10-CM

## 2021-08-26 DIAGNOSIS — K219 Gastro-esophageal reflux disease without esophagitis: Secondary | ICD-10-CM

## 2021-08-26 DIAGNOSIS — E7800 Pure hypercholesterolemia, unspecified: Secondary | ICD-10-CM

## 2021-08-26 DIAGNOSIS — E785 Hyperlipidemia, unspecified: Secondary | ICD-10-CM

## 2021-08-26 DIAGNOSIS — Z7901 Long term (current) use of anticoagulants: Secondary | ICD-10-CM

## 2021-08-26 DIAGNOSIS — Z87891 Personal history of nicotine dependence: Secondary | ICD-10-CM

## 2021-08-26 DIAGNOSIS — I749 Embolism and thrombosis of unspecified artery: Secondary | ICD-10-CM

## 2021-08-26 NOTE — Patient Instructions
Thank you for visiting our office today.    We would like to make the following medication adjustments:  NONE      Otherwise continue the same medications as you have been doing.          We will be pursuing the following tests after your appointment today:       Orders Placed This Encounter    LIPID PROFILE    ECG 12-LEAD         We will plan to see you back in 12 months with FASTING labs prior.  Please call us in the meantime with any questions or concerns.        Please allow 5-7 business days for our providers to review your results. All normal results will go to MyChart. If you do not have Mychart, it is strongly recommended to get this so you can easily view all your results. If you do not have mychart, we will attempt to call you once with normal lab and testing results. If we cannot reach you by phone with normal results, we will send you a letter.  If you have not heard the results of your testing after one week please give us a call.       Your Cardi[facility] Team (Steve, Lisa, Jamie, Melanie, and Oziel Beitler)  phone number is [phone].

## 2021-10-03 ENCOUNTER — Encounter: Admit: 2021-10-03 | Discharge: 2021-10-03 | Payer: MEDICARE

## 2021-10-03 NOTE — Telephone Encounter
INR Overdue.  Called and left message requesting pt to have INR drawn at earliest convenience.  Left callback number for any questions or concerns.

## 2021-10-06 ENCOUNTER — Encounter: Admit: 2021-10-06 | Discharge: 2021-10-06 | Payer: MEDICARE

## 2021-10-06 DIAGNOSIS — I2510 Atherosclerotic heart disease of native coronary artery without angina pectoris: Secondary | ICD-10-CM

## 2021-10-06 DIAGNOSIS — I10 Essential (primary) hypertension: Secondary | ICD-10-CM

## 2021-10-06 DIAGNOSIS — I739 Peripheral vascular disease, unspecified: Secondary | ICD-10-CM

## 2021-10-06 DIAGNOSIS — Z7901 Long term (current) use of anticoagulants: Secondary | ICD-10-CM

## 2021-10-06 DIAGNOSIS — E7800 Pure hypercholesterolemia, unspecified: Secondary | ICD-10-CM

## 2021-10-06 DIAGNOSIS — Z792 Long term (current) use of antibiotics: Secondary | ICD-10-CM

## 2021-10-06 DIAGNOSIS — Z136 Encounter for screening for cardiovascular disorders: Secondary | ICD-10-CM

## 2021-10-06 DIAGNOSIS — Z86711 Personal history of pulmonary embolism: Secondary | ICD-10-CM

## 2021-10-06 DIAGNOSIS — Z951 Presence of aortocoronary bypass graft: Secondary | ICD-10-CM

## 2021-10-06 DIAGNOSIS — I252 Old myocardial infarction: Secondary | ICD-10-CM

## 2021-10-06 LAB — LIPID PROFILE
CHOLESTEROL/HDL %: 3
CHOLESTEROL: 119
HDL: 35 — ABNORMAL LOW (ref >=40)
LDL: 54
TRIGLYCERIDES: 154 — ABNORMAL HIGH (ref <150)
VLDL: 31

## 2021-10-06 LAB — PROTIME INR (PT): INR: 2.4 — ABNORMAL HIGH (ref 0.89–1.11)

## 2021-11-03 ENCOUNTER — Encounter: Admit: 2021-11-03 | Discharge: 2021-11-03 | Payer: MEDICARE

## 2021-11-03 DIAGNOSIS — Z7901 Long term (current) use of anticoagulants: Secondary | ICD-10-CM

## 2021-11-03 DIAGNOSIS — Z86711 Personal history of pulmonary embolism: Secondary | ICD-10-CM

## 2021-11-04 ENCOUNTER — Encounter: Admit: 2021-11-04 | Discharge: 2021-11-04 | Payer: MEDICARE

## 2021-11-11 ENCOUNTER — Encounter: Admit: 2021-11-11 | Discharge: 2021-11-11 | Payer: MEDICARE

## 2021-11-11 DIAGNOSIS — N189 Chronic kidney disease, unspecified: Secondary | ICD-10-CM

## 2021-11-11 DIAGNOSIS — Z951 Presence of aortocoronary bypass graft: Secondary | ICD-10-CM

## 2021-11-11 DIAGNOSIS — I10 Essential (primary) hypertension: Secondary | ICD-10-CM

## 2021-11-11 NOTE — Telephone Encounter
Patient notified of request for lab.

## 2021-11-11 NOTE — Telephone Encounter
-----   Message from Altamease Oiler, MD sent at 11/10/2021  3:26 PM CST -----  His renal function looks little bit worse than it has in the past.  Lets plan to repeat labs in about a week.  If creatinine still elevated we may need to cut back on his losartan.  Can address with Dr. Maisie Fus at that time.  Thanks Brett Canales  ----- Message -----  From: Weston Brass  Sent: 11/04/2021   8:30 AM CST  To: Altamease Oiler, MD    Labs for your review and recommendations

## 2021-11-17 ENCOUNTER — Encounter: Admit: 2021-11-17 | Discharge: 2021-11-17 | Payer: MEDICARE

## 2021-11-17 DIAGNOSIS — Z951 Presence of aortocoronary bypass graft: Secondary | ICD-10-CM

## 2021-11-17 DIAGNOSIS — N189 Chronic kidney disease, unspecified: Secondary | ICD-10-CM

## 2021-11-17 DIAGNOSIS — I10 Essential (primary) hypertension: Secondary | ICD-10-CM

## 2021-11-17 LAB — BASIC METABOLIC PANEL
BLD UREA NITROGEN: 35 — ABNORMAL HIGH (ref 8.4–25.7)
CALCIUM: 9.4
CHLORIDE: 108 — ABNORMAL HIGH (ref 98–107)
CO2: 23
CREATININE: 1.8 — ABNORMAL HIGH (ref 0.72–125)
GLUCOSE,PANEL: 82
POTASSIUM: 5.4 — ABNORMAL HIGH (ref 3.5–5.1)
SODIUM: 140

## 2021-12-17 ENCOUNTER — Encounter: Admit: 2021-12-17 | Discharge: 2021-12-17 | Payer: MEDICARE

## 2021-12-17 DIAGNOSIS — I10 Essential (primary) hypertension: Secondary | ICD-10-CM

## 2021-12-17 DIAGNOSIS — E875 Hyperkalemia: Secondary | ICD-10-CM

## 2021-12-17 NOTE — Telephone Encounter
Called and left message with recommendations.  Faxed lab request to [facility].

## 2021-12-17 NOTE — Telephone Encounter
-----   Message from Altamease Oiler, MD sent at 12/16/2021  4:54 PM CST -----  Lets go ahead and repeat basic metabolic panel.  Thank you  ----- Message -----  From: Rogelia Boga, RN  Sent: 11/17/2021   1:35 PM CST  To: Altamease Oiler, MD    Labs for your review.  Potassium still high.  Creatinine better, but still high.  Sent copy of lab results to PCPs office for review and recommendations also.  Please let the [facility] RNs know your recommendations

## 2021-12-19 ENCOUNTER — Encounter: Admit: 2021-12-19 | Discharge: 2021-12-19 | Payer: MEDICARE

## 2021-12-19 DIAGNOSIS — I10 Essential (primary) hypertension: Secondary | ICD-10-CM

## 2021-12-19 DIAGNOSIS — Z7901 Long term (current) use of anticoagulants: Secondary | ICD-10-CM

## 2021-12-19 DIAGNOSIS — E875 Hyperkalemia: Secondary | ICD-10-CM

## 2021-12-19 DIAGNOSIS — Z86711 Personal history of pulmonary embolism: Secondary | ICD-10-CM

## 2021-12-19 LAB — BASIC METABOLIC PANEL
ANION GAP: 12
BLD UREA NITROGEN: 32 — ABNORMAL HIGH (ref 8.4–25.7)
CALCIUM: 9.1
CHLORIDE: 108 — ABNORMAL HIGH (ref 98–107)
CO2: 17 — ABNORMAL LOW (ref 23–31)
CREATININE: 1.6 — ABNORMAL HIGH (ref 0.72–1.25)
GFR ESTIMATED: 41 — ABNORMAL LOW (ref >59)
GLUCOSE,PANEL: 88
POTASSIUM: 4.9
SODIUM: 137

## 2021-12-19 LAB — PROTIME INR (PT): INR: 2.2

## 2021-12-19 NOTE — Telephone Encounter
INR Overdue. Called and left message requesting pt to have INR drawn at earliest convenience.  Left callback number for any questions or concerns.

## 2022-02-16 DEATH — deceased

## 2023-01-16 IMAGING — MR Head^Brain
14 of 15 series · 42 of 48 positions shown · IV contrast (with contrast)
Comparison: none

[Series 4: T1 · sagittal · 5.0mm · 0.45mm/px · 2 of 20 slices shown (1 of 3)]
[im 1/20]
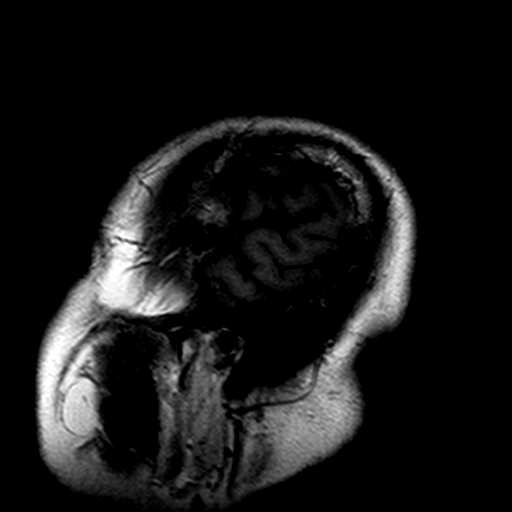
[im 20/20]
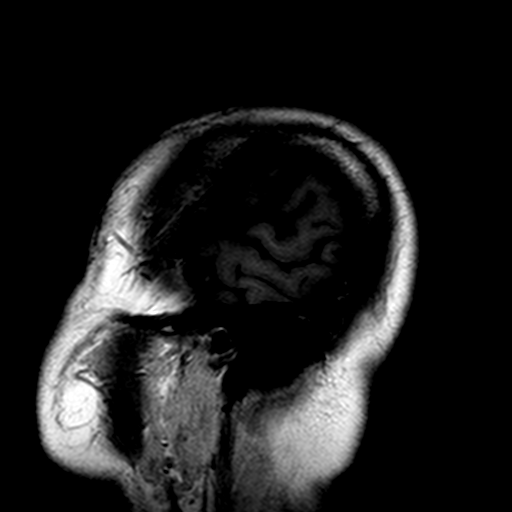

[Series 5: DWI · axial · 5.0mm · 1.80mm/px · z∈[-25,+98]mm · 8 of 63 slices shown (1 of 2)]
[im 1/63]
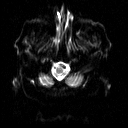
[im 9/63]
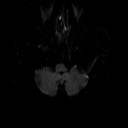
[im 18/63]
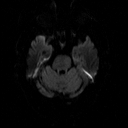
[im 27/63]
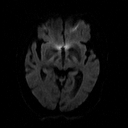
[im 36/63]
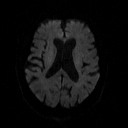
[im 45/63]
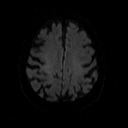
[im 54/63]
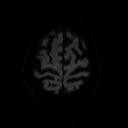
[im 63/63]
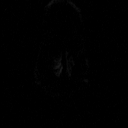

[Series 6: DWI · axial · 5.0mm · 1.80mm/px · z∈[-25,+98]mm · 3 of 21 slices shown (2 of 2)]
[im 1/21]
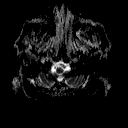
[im 11/21]
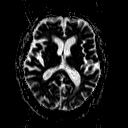
[im 21/21]
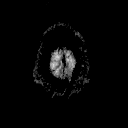

[Series 10: FLAIR · axial · 5.0mm · 0.45mm/px · z∈[-39,+103]mm · 3 of 24 slices shown]
[im 1/24]
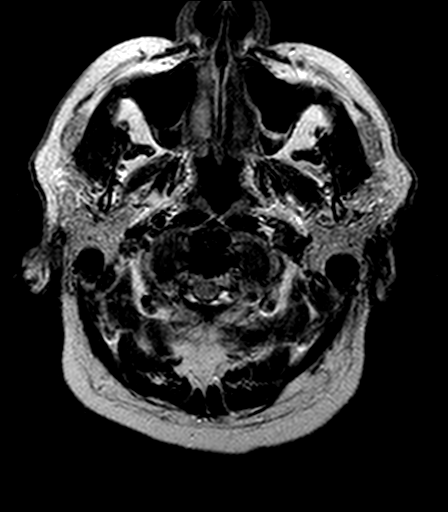
[im 12/24]
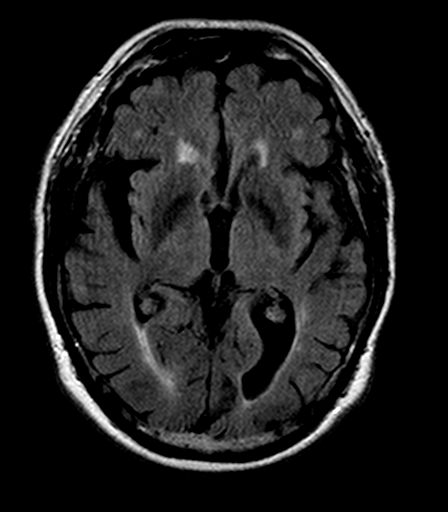
[im 24/24]
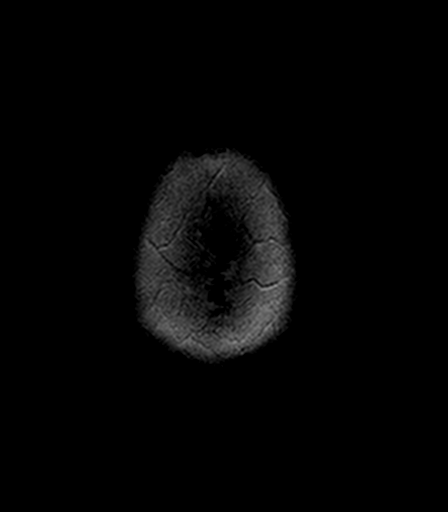

[Series 11: T2 · axial · 5.0mm · 0.72mm/px · z∈[-39,+103]mm · 3 of 24 slices shown (1 of 2)]
[im 1/24]
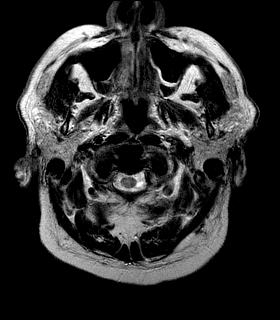
[im 12/24]
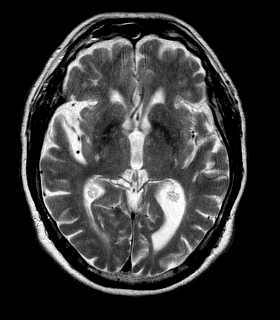
[im 24/24]
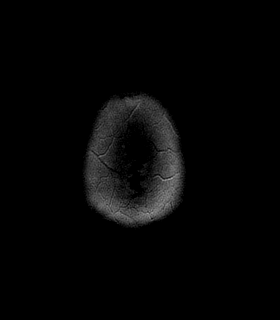

[Series 12: T1 · axial · 5.0mm · 0.45mm/px · z∈[-39,+103]mm · 3 of 24 slices shown (2 of 3)]
[im 1/24]
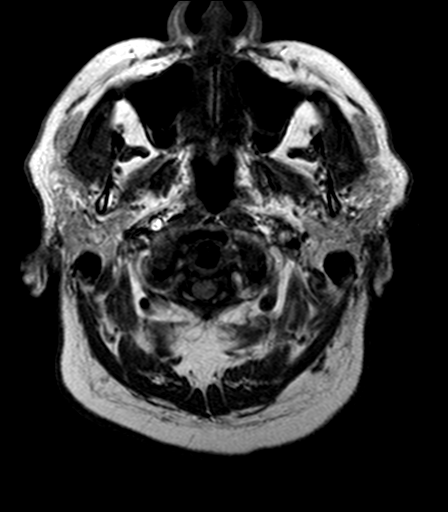
[im 12/24]
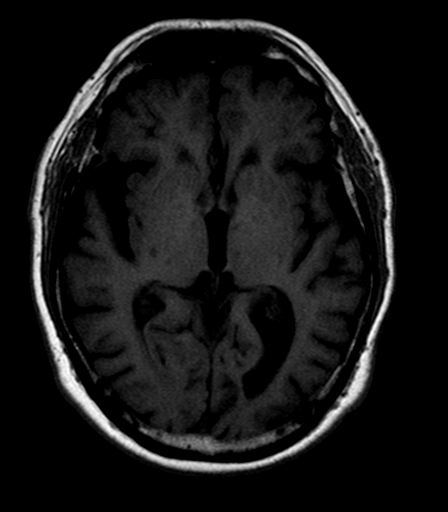
[im 24/24]
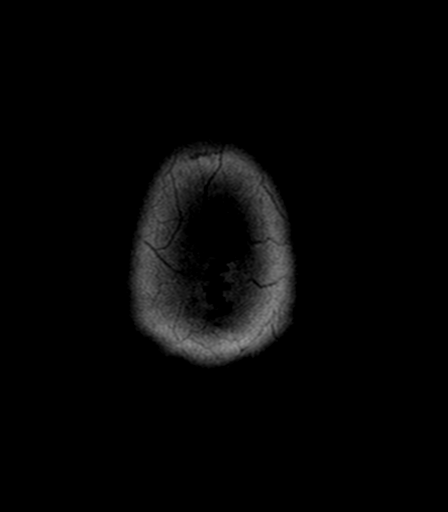

[Series 13: axial blood · axial · 5.0mm · 0.45mm/px · z∈[-39,+29]mm · 2 of 24 slices shown]
[im 1/24]
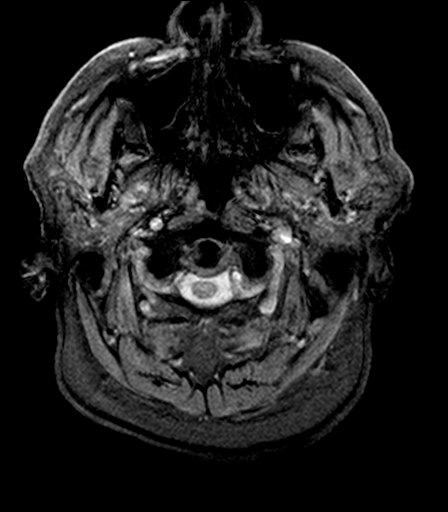
[im 12/24]
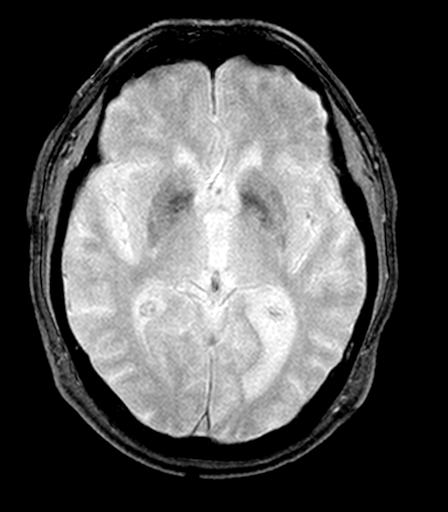

[Series 14: T2 · coronal · 5.0mm · 0.69mm/px · 3 of 26 slices shown (2 of 2)]
[im 1/26]
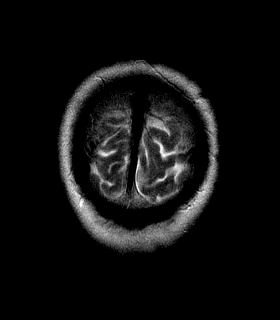
[im 13/26]
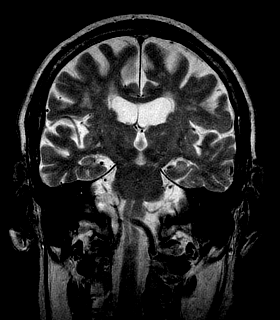
[im 26/26]
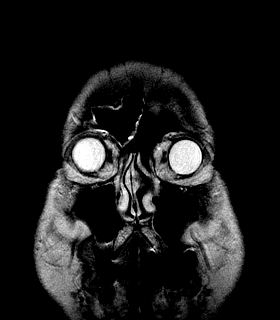

[Series 15: T1 · coronal · 3.0mm · 0.37mm/px · 2 of 18 slices shown (3 of 3)]
[im 1/18]
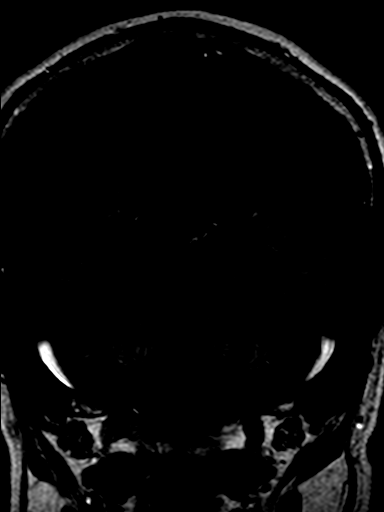
[im 18/18]
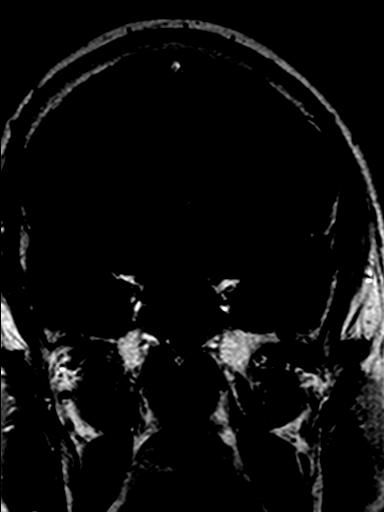

[Series 17: T2 fat-sat · coronal · 5.0mm · 0.90mm/px · 3 of 21 slices shown]
[im 1/21]
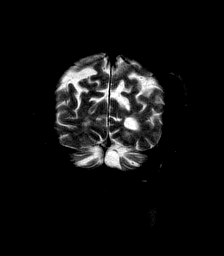
[im 11/21]
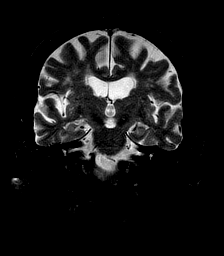
[im 21/21]
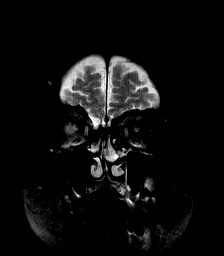

[Series 18: T1 post-contrast · axial · 3.0mm · 0.35mm/px · z∈[-49,+11]mm · 2 of 20 slices shown (1 of 2)]
[im 1/20]
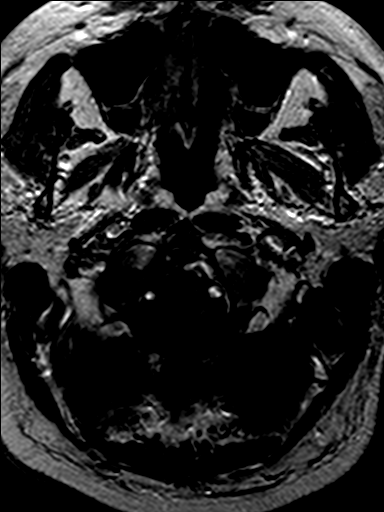
[im 20/20]
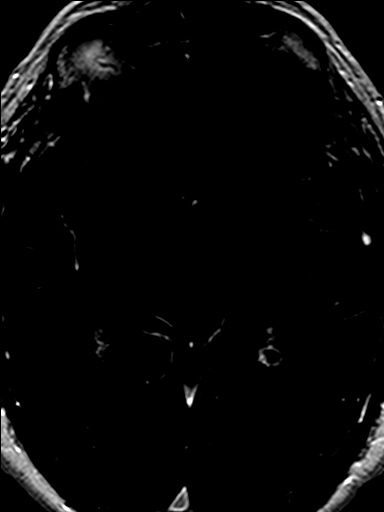

[Series 19: T1 post-contrast · coronal · 3.0mm · 0.37mm/px · 2 of 16 slices shown (2 of 2)]
[im 1/16]
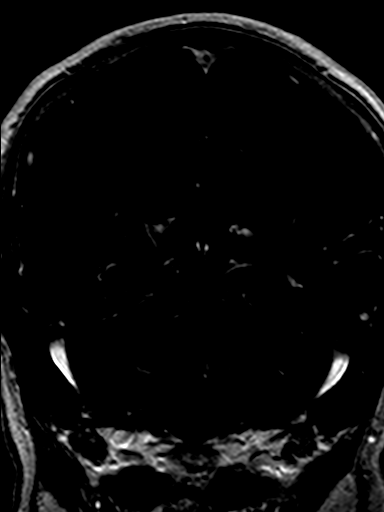
[im 16/16]
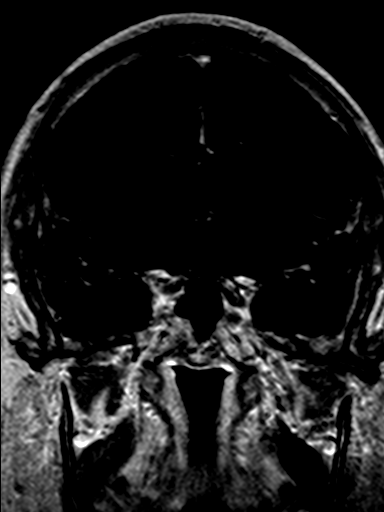

[Series 20: T1 fat-sat post-contrast · axial · 5.0mm · 0.90mm/px · z∈[-28,+96]mm · 3 of 21 slices shown (1 of 2)]
[im 1/21]
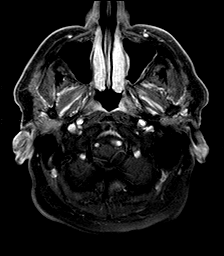
[im 11/21]
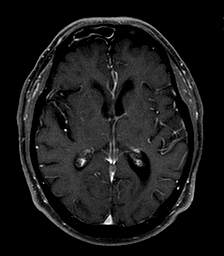
[im 21/21]
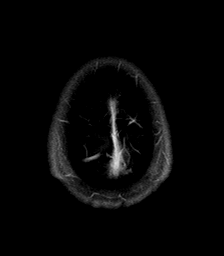

[Series 21: T1 fat-sat post-contrast · coronal · 5.0mm · 0.90mm/px · 3 of 26 slices shown (2 of 2)]
[im 1/26]
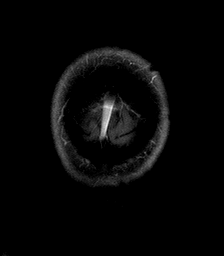
[im 13/26]
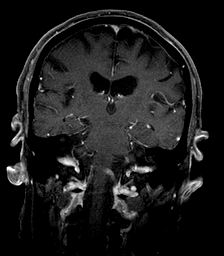
[im 26/26]
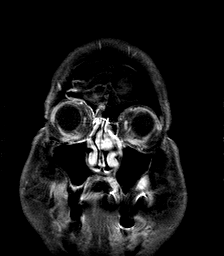

[42 of 48 positions shown; findings below may reference images not displayed]

DIAGNOSTIC STUDIES

EXAM

MAGNETIC RESONANCE IMAGING, BRAIN (INCLUDING BRAIN STEM) WITHOUT CONTRAST MATERIAL, FOLLOWED BY
CONTRAST MATERIAL(S) AND FURTHER SEQUENCES, CPT 02330

INDICATION

Left trigeminal neuralgia.

TECHNIQUE

Multiplanar, multipulse sequence images of the head with and without contrast. Contrast volume
Brand name contrast was administered IV/Orally.

COMPARISONS

No priors available for comparison.

FINDINGS

Moderate atrophy. The midline structures are intact. There is no evidence of tonsillar ectopia.
Normal flow voids are visualized within the vertebral, basilar and internal carotid arteries. There
is no evidence of acute hemorrhage or hydrocephalus. There is no evidence of restricted diffusion.
There is no mass effect or intra/extra-axial fluid collections. Mild mucosal thickening within the
right frontal sinus and ethmoid air cells. The mastoid air cells appear well aerated.

IMPRESSION

Moderate atrophy. No acute hemorrhage, masses or acute infarction.

Tech Notes:

PAIN FROM LEFT SIDE OF NOSE THRU THE LEFT EYE, HEADACHES.  ACUTE ONSET.  15 ML GADAVIST RG
